# Patient Record
Sex: Male | Born: 1955 | Race: White | Hispanic: No | Marital: Married | Smoking: Never smoker
Health system: Southern US, Community
[De-identification: ages and names within clinical notes are randomized; demographics above are authoritative.]

## PROBLEM LIST (undated history)

## (undated) DIAGNOSIS — Z972 Presence of dental prosthetic device (complete) (partial): Secondary | ICD-10-CM

## (undated) DIAGNOSIS — K08109 Complete loss of teeth, unspecified cause, unspecified class: Secondary | ICD-10-CM

## (undated) DIAGNOSIS — K2270 Barrett's esophagus without dysplasia: Secondary | ICD-10-CM

## (undated) DIAGNOSIS — K219 Gastro-esophageal reflux disease without esophagitis: Secondary | ICD-10-CM

## (undated) DIAGNOSIS — R351 Nocturia: Secondary | ICD-10-CM

## (undated) DIAGNOSIS — R519 Headache, unspecified: Secondary | ICD-10-CM

## (undated) DIAGNOSIS — Z973 Presence of spectacles and contact lenses: Secondary | ICD-10-CM

## (undated) DIAGNOSIS — N201 Calculus of ureter: Secondary | ICD-10-CM

## (undated) DIAGNOSIS — Z8719 Personal history of other diseases of the digestive system: Secondary | ICD-10-CM

## (undated) DIAGNOSIS — H5089 Other specified strabismus: Secondary | ICD-10-CM

## (undated) DIAGNOSIS — E785 Hyperlipidemia, unspecified: Secondary | ICD-10-CM

## (undated) DIAGNOSIS — Z8711 Personal history of peptic ulcer disease: Secondary | ICD-10-CM

## (undated) DIAGNOSIS — Z860101 Personal history of adenomatous and serrated colon polyps: Secondary | ICD-10-CM

## (undated) DIAGNOSIS — N401 Enlarged prostate with lower urinary tract symptoms: Secondary | ICD-10-CM

## (undated) DIAGNOSIS — H509 Unspecified strabismus: Principal | ICD-10-CM

## (undated) DIAGNOSIS — M1990 Unspecified osteoarthritis, unspecified site: Secondary | ICD-10-CM

## (undated) DIAGNOSIS — I10 Essential (primary) hypertension: Secondary | ICD-10-CM

## (undated) DIAGNOSIS — Z87442 Personal history of urinary calculi: Secondary | ICD-10-CM

## (undated) DIAGNOSIS — Z86010 Personal history of colonic polyps: Secondary | ICD-10-CM

## (undated) HISTORY — DX: Other specified strabismus: H50.89

## (undated) HISTORY — DX: Unspecified strabismus: H50.9

## (undated) HISTORY — PX: COLONOSCOPY: SHX174

## (undated) HISTORY — PX: OTHER SURGICAL HISTORY: SHX169

---

## 2001-12-12 ENCOUNTER — Emergency Department (HOSPITAL_COMMUNITY): Admission: EM | Admit: 2001-12-12 | Discharge: 2001-12-12 | Payer: Self-pay | Admitting: Emergency Medicine

## 2001-12-12 ENCOUNTER — Encounter: Payer: Self-pay | Admitting: Emergency Medicine

## 2002-08-09 ENCOUNTER — Emergency Department (HOSPITAL_COMMUNITY): Admission: EM | Admit: 2002-08-09 | Discharge: 2002-08-09 | Payer: Self-pay

## 2005-06-22 ENCOUNTER — Encounter: Admission: RE | Admit: 2005-06-22 | Discharge: 2005-06-22 | Payer: Self-pay | Admitting: Internal Medicine

## 2005-11-21 ENCOUNTER — Emergency Department (HOSPITAL_COMMUNITY): Admission: EM | Admit: 2005-11-21 | Discharge: 2005-11-21 | Payer: Self-pay | Admitting: Emergency Medicine

## 2005-11-25 ENCOUNTER — Ambulatory Visit (HOSPITAL_COMMUNITY): Admission: RE | Admit: 2005-11-25 | Discharge: 2005-11-25 | Payer: Self-pay | Admitting: Sports Medicine

## 2005-11-26 ENCOUNTER — Ambulatory Visit (HOSPITAL_COMMUNITY): Admission: RE | Admit: 2005-11-26 | Discharge: 2005-11-27 | Payer: Self-pay | Admitting: Orthopedic Surgery

## 2005-12-08 ENCOUNTER — Ambulatory Visit (HOSPITAL_COMMUNITY): Admission: RE | Admit: 2005-12-08 | Discharge: 2005-12-09 | Payer: Self-pay | Admitting: Orthopedic Surgery

## 2006-01-06 ENCOUNTER — Encounter: Admission: RE | Admit: 2006-01-06 | Discharge: 2006-04-06 | Payer: Self-pay | Admitting: Orthopedic Surgery

## 2006-04-20 ENCOUNTER — Encounter: Admission: RE | Admit: 2006-04-20 | Discharge: 2006-07-19 | Payer: Self-pay | Admitting: Orthopedic Surgery

## 2009-05-15 ENCOUNTER — Ambulatory Visit: Payer: Self-pay | Admitting: Gastroenterology

## 2009-05-27 ENCOUNTER — Ambulatory Visit: Payer: Self-pay | Admitting: Gastroenterology

## 2009-05-27 ENCOUNTER — Encounter: Payer: Self-pay | Admitting: Gastroenterology

## 2009-06-05 ENCOUNTER — Encounter: Payer: Self-pay | Admitting: Gastroenterology

## 2011-04-17 NOTE — Op Note (Signed)
NAME:  Jason Guzman, Jason Guzman                  ACCOUNT NO.:  [id number]   MEDICAL RECORD NO.:  [id number]          PATIENT TYPE:  AMB   LOCATION:  SDS                          FACILITY:  [facility]   PHYSICIAN:  Deidre Ala, M.D.    DATE OF BIRTH:  1956-06-02   DATE OF PROCEDURE:  DATE OF DISCHARGE:                                 OPERATIVE REPORT   PREOPERATIVE DIAGNOSIS:  Complex recurrent posterior dislocation left elbow  with ligamentous injury radial head fracture.   POSTOPERATIVE DIAGNOSIS:  Complex recurrent posterior dislocation left elbow  with ligamentous injury radial head fracture.   PROCEDURES:  1.  Closed reduction of complex fracture dislocation ligamentous injury with      posterior elbow dislocation and placement of large size Synthes external      fixator.  2.  Use of intraoperative fluoroscopy.   SURGEON:  1.  Charlesetta Shanks, M.D.   ASSISTANT:  Clarene Reamer, P.A.-C.   ANESTHESIA:  General endotracheal anesthesia.   CULTURES:  None.   DRAINS:  None.   ESTIMATED BLOOD LOSS:  Minimal.   PATHOLOGIC FINDINGS AND HISTORY:  Jason Guzman fell from a scaffolding five days  ago, was seen at [facility] ER where posterior dislocation of the  elbow was noted.  He was reduced in the emergency room, splinted and came in  to see Dr. Farris Has two days ago.  Dr. Jerl Santos was the person on call.  In  any case, Dr. Farris Has had ordered a CT scan which showed the elbow to be  redislocated after post reduction films had shown the reduction in the  emergency room.  He was referred back to [place].  He had good pulses, so we  elected to proceed to take him to the operating room for closed reduction  and some sort of fixation, I was thinking a transfixing pin.  Dr. Carola Guzman was  consulted about the case and felt that an external fixator would be what he  would like to see put on for control of the lesion until decision could be  made about repairing collateral ligaments in the radial head.  It  became  more apparent that the radial head fracture had somewhat subluxed  posteriorly and had sheared off dorsally.  The cornoid process was basically  stable.  In any case we then preceded to reduce the dislocation with a  concentric reduction on the lateral view and we placed Synthes large  external fixator with two short Shantz pins dorsal to volar on the ulna and  basically anterior posterior on the humerus just at the shaft diaphyseal,  metaphyseal junction and further proximal.  We actually did it slightly  posterolateral, but more posterior.  We were able to get the reduction  obtained using traction on the crisscross rods and we did use some flexion.  The pulse was intact to Doppler evaluation at closure with all of the  hardware tightened up on the external fixator.   DESCRIPTION OF PROCEDURE:  With adequate anesthesia obtained using  endotracheal technique, the patient was placed in the supine position.  We  first reduced the elbow but felt it to be very unstable.  Consultation was  obtained from Dr. Carola Guzman who was in the operating room and we then prepped  and draped the arm, placed under C-arm fluoroscopy, the two short Shantz  pins in the ulna mid shaft and distal humerus large Shantz pins posteriorly  from front to back.  C-arm fluoroscopy confirmed positioning.  The external  fixator was put in place with crisscross of long carbon fiber rods that met  at the elbow area with the hinge. We then reduced the fracture, flexed it  slightly, pushed the ulna forward, got a concentric reduction, applied  traction and the flexion and set all the nuts and bolts of the fixator so it  was rigid.  The pin tracks were then dressed with a compressive Ace wrap.  Sling was placed.  The Doppler was used to check the pulse.  The patient  then, having tolerated the procedure well, was to be discharged per  outpatient routine to see Dr.  Carola Guzman next Wednesday for follow-up.  Dr.  Carola Guzman will see  him in the PACU.           ______________________________  V. Charlesetta Shanks, M.D.     VEP/MEDQ  D:  11/26/2005  T:  11/26/2005  Job:  [id number]   cc:   Jason Guzman, M.D.  Fax: [phone]   Jason Guzman, M.D.  Fax: [phone]   Jason Guzman, M.D.  Fax: [phone]

## 2011-04-17 NOTE — Op Note (Signed)
NAME:  Jason Guzman, Jason Guzman                  ACCOUNT NO.:  [id number]   MEDICAL RECORD NO.:  [id number]          PATIENT TYPE:  INP   LOCATION:  5729                         FACILITY:  [facility]   PHYSICIAN:  Doralee Albino. Carola Frost, M.D. DATE OF BIRTH:  06/03/1956   DATE OF PROCEDURE:  12/08/2005  DATE OF DISCHARGE:                                 OPERATIVE REPORT   PREOPERATIVE DIAGNOSIS:  Left elbow fracture dislocation, status post  external fixation.   POSTOPERATIVE DIAGNOSIS:  1.  Severely comminuted left radial head fracture.  2.  Complete disruption of Jason lateral collateral and medial collateral      ligaments.  3.  Multiple fracture fragments off Jason radius as well as Jason tip of Jason      coronoid in Jason elbow joint.  4.  Retained external fixator, left elbow.   PROCEDURES:  1.  Radial head arthroplasty using a [facility] long neck length with 22-mm      head.  2.  Open treatment of elbow dislocation.  3.  Lateral collateral ligament repair with local tissue.  4.  Removal of external fixator and under anesthesia.   SURGEON:  Myrene Galas.   ASSISTANT:  None.   ANESTHESIA:  General and regional.   SPECIMENS:  None.   TOURNIQUET:  None.   ESTIMATED BLOOD LOSS:  550 mL.   IV FLUIDS:  2350 mL crystalloid.   COMPLICATIONS:  None.   DISPOSITION:  PACU,  condition stable.   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 Jerl Santos with closed  reduction. In his absence, he followed up with Dr. Renae Fickle with a recurrent  dislocation. He was taken to Jason operating room where underwent spanning  external fixation with reduction of Jason joint.  He was subsequently referred  to me for definitive management. At Jason time I initially saw him, he was  markedly swollen and unable to undergo surgery at that time interval.  He  followed up a week  later where Jason soft tissues indicated they were ready to  proceed. Preoperatively we discussed risks and benefits of surgery including  Jason possibility of arthritis, instability, poor range of motion, malunion,  nonunion, thromboembolic complications. In addition, other perioperative  complications and, of course, also infection and neurovascular injury. Jason  patient and his Guzman wished to proceed.   DESCRIPTION OF PROCEDURE:  Jason Guzman was administered preoperative  antibiotics, taken to Jason operating room after administration of a regional  block in Jason preoperative area and general anesthesia was induced. He was  positioned supine. His left upper extremity was prepped and draped in Jason  usual sterile fashion.  After removing Jason external fixator, Jason pin sites  were cleaned with both Hibiclens scrub as well as a standard Betadine scrub  and paint.   A single posterior incision was made to facilitate any further surgery  required to Jason elbow and dissection carried at Jason fascial layer laterally  to Jason [facility]  interval. Jason muscle was split along Jason anconeus and Jason  radiocapitellar joint entered.  There was a shear injury of Jason radial head  as anticipated with severe comminution.  There were multiple bone fragments  that required removal.  It was clearly not reconstructable.  Jason lateral  collateral ligament had been completely avulsed from its insertion on Jason  lateral epicondyle as well.  There was a fleck of bone which had flipped  posteriorly associated with this.  There also could be felt some injury to  Jason coronoid process and multiple free fragments in Jason anterior aspect of  Jason joint. These would eventually be removed as well.  We were careful to  keep Jason posterior interosseous nerve protected throughout Jason procedure  using pronation of Jason forearm during dissection and doing all dissection  under direct visualization. Jason saw was used to make a transverse cut and  then  Jason gap was sized to Jason extended length radial head prosthesis. There  were no significant chondral injuries to gross inspection of Jason capitellum  or olecranon fossa where visualized. What remained of Jason radial head was  sized to a 24.  Both Jason 24 and 22 trials were inserted with Jason best fit  being with Jason 22. This dramatically improved Jason stability of Jason elbow and  enabled [place] to also perform a gentle manipulation to bring Jason patient's arm  back into more extension, given his prolonged flexion. Jason radial shaft was  repaired with broaching and a #3 stem and long neck and 22 mm prosthesis  were then implanted using standard technique.  Jason implant appeared to be  completely flush against Jason radial cut with excellent rotational control as  well. Once again, Jason elbow was taken through a range of motion and noted to  be grossly stable. There was rotatory instability, given Jason lack of repair  of Jason lateral collateral ligament at that point.   We then directed our attention toward Jason rest of Jason joint where we felt  that sufficient stability could be restored to Jason elbow without Jason need  for direct internal fixation of Jason multiple comminuted fractures off Jason  proximal ulna.  Consequently these were excised.  We then used Jason suture  anchor to place one in Jason central portion of Jason lateral collateral  ligament insertion on Jason lateral epicondyle. Jason area was abraded with a  rongeur and then a rugged rasp prior to placement of Jason anchor device.  Jason  initial anchor device was tested for stability and popped out. We then  placed a more stout 5 mm anchor with good pullout strength direct testing.  We then imbricated Jason local tissue in and around Jason area with some  considerable difficulty using a free needle.  We were able over time to  produce a very stout repair bringing elements of Jason capsule and lateral collateral ligament both anteriorly and posteriorly along with Jason  fleck of  bone that had been involved in Jason initial avulsion injury off Jason lateral  epicondyle. Jason base of Jason radiocapitellar joint was also imbricated. Jason  imbrication sutures were tied at multiple levels and extended all Jason way up  to Jason fascial repair of Jason Kocher interval using Jason FiberWire. Jason  radiocapitellar joint was closed with figure-of-eight #1 Vicryl as was Jason  fascia of Jason Kocher interval.  At that time we then examined Jason elbow for  stability and noted excellent stability with easy range of motion  from 30  degrees to 130 degrees. AP and lateral images showed concentric reduction  and excellent fit of Jason radial head prosthesis. Jason wound was then  copiously irrigated and Jason remainder closed in standard layered fashion  with 2-0 Vicryl and staples. A sterile gently compressive dressing and  posterior splint were then applied with Jason patient's elbow extended  slightly beyond 90 degrees. He is taken to PACU in stable condition.   PROGNOSIS:  Jason Guzman has sustained a severe injury to his left elbow and is  at significant risk for Jason development of complications related to this  including arthritis, persistent instability, decreased range of motion as  well as infection given Jason prior external fixator. We plan to see him back  in 8 days for removal of Jason splint and initiation of passive range of  motion in a supervised fashion with occupational therapy,  We will then  removed his staples at  two weeks.  I remain concerned about his ability to participate with  physical and occupational therapy, given his limited financial resources in  that he may be unable to pay for therapy which is absolutely necessary for a  good outcome from this injury.      Doralee Albino. Carola Frost, M.D.  Electronically Signed     MHH/MEDQ  D:  12/08/2005  T:  12/09/2005  Job:  [id number]

## 2011-08-24 ENCOUNTER — Other Ambulatory Visit: Payer: Self-pay | Admitting: Gastroenterology

## 2011-08-24 ENCOUNTER — Other Ambulatory Visit: Payer: Self-pay | Admitting: Internal Medicine

## 2011-08-31 ENCOUNTER — Ambulatory Visit
Admission: RE | Admit: 2011-08-31 | Discharge: 2011-08-31 | Disposition: A | Discharge status: Home / Self Care | Payer: 59 | Source: Ambulatory Visit | Attending: Gastroenterology | Admitting: Gastroenterology

## 2011-08-31 MED ORDER — IOHEXOL 300 MG/ML  SOLN
125.0000 mL | Freq: Once | INTRAMUSCULAR | Status: AC | PRN
  Administered 2011-08-31: 125 mL via INTRAVENOUS

## 2011-09-07 ENCOUNTER — Other Ambulatory Visit: Payer: Self-pay | Admitting: Gastroenterology

## 2011-09-07 HISTORY — PX: ESOPHAGOGASTRODUODENOSCOPY: SHX1529

## 2013-02-06 ENCOUNTER — Other Ambulatory Visit: Payer: Self-pay | Admitting: Gastroenterology

## 2013-08-04 ENCOUNTER — Encounter: Payer: Self-pay | Admitting: Neurology

## 2013-08-04 DIAGNOSIS — H02409 Unspecified ptosis of unspecified eyelid: Secondary | ICD-10-CM | POA: Insufficient documentation

## 2013-08-04 DIAGNOSIS — H02401 Unspecified ptosis of right eyelid: Secondary | ICD-10-CM

## 2013-08-07 ENCOUNTER — Encounter: Payer: Self-pay | Admitting: Neurology

## 2013-08-07 ENCOUNTER — Ambulatory Visit (INDEPENDENT_AMBULATORY_CARE_PROVIDER_SITE_OTHER): Payer: 59 | Admitting: Neurology

## 2013-08-07 VITALS — BP 179/93 | HR 67 | Ht 67.0 in | Wt 236.0 lb

## 2013-08-07 DIAGNOSIS — H519 Unspecified disorder of binocular movement: Secondary | ICD-10-CM

## 2013-08-07 DIAGNOSIS — H509 Unspecified strabismus: Secondary | ICD-10-CM

## 2013-08-07 HISTORY — DX: Unspecified strabismus: H50.9

## 2013-08-07 NOTE — Progress Notes (Signed)
Reason for visit: Double vision  Jason Guzman is a 57 y.o. 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 The patient indicates that he does not have double vision when looking straight ahead, looking up, looking down, or looking to the right. When he looks to the left, the double vision ensues. The patient has some narrowing of the interpalpebral fissure. The patient denies any other associated symptoms of headache, speech problems, swallowing problems, numbness or weakness of the face, arms, or legs. This double vision issue has been present throughout his entire life, and he was told as a child that he would "grow out of it", but this never happened. The patient was seen by his optometrist, and he was referred to this office for an evaluation. The visual acuity from one eye to next is normal and symmetric.  Past Medical History  Diagnosis Date  . Strabismus 08/07/2013  . Duane retraction syndrome     left    Past Surgical History  Procedure Laterality Date  . Orif elbow fracture Left     History reviewed. No pertinent family history.  Social history:  reports that he has never smoked. He has never used smokeless tobacco. He reports that he does not drink alcohol or use illicit drugs.  Medications:  No current outpatient prescriptions on file prior to visit.   No current facility-administered medications on file prior to visit.     Allergies no known allergies  ROS:  Out of a complete 14 system review of symptoms, the patient complains only of the following symptoms, and all other reviewed systems are negative.  Double vision   Blood pressure 179/93, pulse 67, height 5\' 7"  (1.702 m), weight 236 lb (107.049 kg).  Physical Exam  General: The patient is alert and cooperative at the time of the examination. The patient is minimally obese.  Head: Pupils  are equal, round, and reactive to light. Discs are flat bilaterally.  Neck: The neck is supple, no carotid bruits are noted.  Respiratory: The respiratory examination is clear.  Cardiovascular: The cardiovascular examination reveals a regular rate and rhythm, no obvious murmurs or rubs are noted.  Skin: Extremities are without significant edema.  Neurologic Exam  Mental status:  Cranial nerves: Facial symmetry is present. There is good sensation of the face to pinprick and soft touch bilaterally. The strength of the facial muscles and the muscles to head turning and shoulder shrug are normal bilaterally. Speech is well enunciated, no aphasia or dysarthria is noted. Extraocular movements are full. Visual fields are full, with exception of incomplete abduction of the left eye with left lateral gaze.  Motor: The motor testing reveals 5 over 5 strength of all 4 extremities. Good symmetric motor tone is noted throughout.  Sensory: Sensory testing is intact to pinprick, soft touch, vibration sensation, and position sense on all 4 extremities. No evidence of extinction is noted.  Coordination: Cerebellar testing reveals good finger-nose-finger and heel-to-shin bilaterally.  Gait and station: Gait is normal. Tandem gait is normal. Romberg is negative. No drift is seen.  Reflexes: Deep tendon reflexes are symmetric and normal bilaterally. Toes are downgoing bilaterally.   Assessment/Plan:  1. 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 The patient may have some retraction of the eye with right conjugate gaze involving the  left eye. The patient fortunately does not report any double vision with looking straight ahead. I am not clear that this issue can be surgically corrected, but if the patient wishes to have an opinion from an ophthalmologist, I will get this set up. The patient otherwise will  followup through this office if needed.  Marlan Palau MD 08/07/2013 8:58 AM  [facility] [address] [place], [place] [address]  Phone [phone] Fax [phone]

## 2014-03-21 ENCOUNTER — Encounter: Payer: Self-pay | Admitting: *Deleted

## 2014-03-23 ENCOUNTER — Encounter: Payer: Self-pay | Admitting: Gastroenterology

## 2014-08-01 ENCOUNTER — Encounter: Payer: Self-pay | Admitting: Gastroenterology

## 2014-08-20 ENCOUNTER — Other Ambulatory Visit: Payer: Self-pay | Admitting: Gastroenterology

## 2017-02-15 DIAGNOSIS — I10 Essential (primary) hypertension: Secondary | ICD-10-CM | POA: Diagnosis not present

## 2017-02-15 DIAGNOSIS — E7800 Pure hypercholesterolemia, unspecified: Secondary | ICD-10-CM | POA: Diagnosis not present

## 2017-02-15 DIAGNOSIS — Z0000 Encounter for general adult medical examination without abnormal findings: Secondary | ICD-10-CM | POA: Diagnosis not present

## 2017-03-02 DIAGNOSIS — H903 Sensorineural hearing loss, bilateral: Secondary | ICD-10-CM | POA: Diagnosis not present

## 2017-03-02 DIAGNOSIS — H9311 Tinnitus, right ear: Secondary | ICD-10-CM | POA: Diagnosis not present

## 2017-03-02 DIAGNOSIS — H9313 Tinnitus, bilateral: Secondary | ICD-10-CM | POA: Diagnosis not present

## 2017-03-02 DIAGNOSIS — H6122 Impacted cerumen, left ear: Secondary | ICD-10-CM | POA: Diagnosis not present

## 2017-03-02 DIAGNOSIS — H8110 Benign paroxysmal vertigo, unspecified ear: Secondary | ICD-10-CM | POA: Diagnosis not present

## 2017-03-27 DIAGNOSIS — R350 Frequency of micturition: Secondary | ICD-10-CM | POA: Diagnosis not present

## 2017-04-14 DIAGNOSIS — R300 Dysuria: Secondary | ICD-10-CM | POA: Diagnosis not present

## 2017-07-10 ENCOUNTER — Encounter (HOSPITAL_COMMUNITY): Payer: Self-pay

## 2017-07-10 ENCOUNTER — Emergency Department (HOSPITAL_COMMUNITY)
Admission: EM | Admit: 2017-07-10 | Discharge: 2017-07-11 | Disposition: A | Discharge status: Home / Self Care | Payer: 59 | Attending: Emergency Medicine | Admitting: Emergency Medicine

## 2017-07-10 DIAGNOSIS — N200 Calculus of kidney: Secondary | ICD-10-CM | POA: Diagnosis not present

## 2017-07-10 DIAGNOSIS — R109 Unspecified abdominal pain: Secondary | ICD-10-CM | POA: Diagnosis present

## 2017-07-10 DIAGNOSIS — I10 Essential (primary) hypertension: Secondary | ICD-10-CM | POA: Insufficient documentation

## 2017-07-10 HISTORY — DX: Essential (primary) hypertension: I10

## 2017-07-10 LAB — URINALYSIS, ROUTINE W REFLEX MICROSCOPIC
BACTERIA UA: NONE SEEN
Bilirubin Urine: NEGATIVE
Glucose, UA: NEGATIVE mg/dL
KETONES UR: NEGATIVE mg/dL
LEUKOCYTES UA: NEGATIVE
Nitrite: NEGATIVE
PROTEIN: NEGATIVE mg/dL
Specific Gravity, Urine: 1.016 (ref 1.005–1.030)
pH: 6 (ref 5.0–8.0)

## 2017-07-10 LAB — CBC
HEMATOCRIT: 43.3 % (ref 39.0–52.0)
Hemoglobin: 15 g/dL (ref 13.0–17.0)
MCH: 29.5 pg (ref 26.0–34.0)
MCHC: 34.6 g/dL (ref 30.0–36.0)
MCV: 85.2 fL (ref 78.0–100.0)
Platelets: 134 10*3/uL — ABNORMAL LOW (ref 150–400)
RBC: 5.08 MIL/uL (ref 4.22–5.81)
RDW: 12.5 % (ref 11.5–15.5)
WBC: 6.1 10*3/uL (ref 4.0–10.5)

## 2017-07-10 LAB — COMPREHENSIVE METABOLIC PANEL
ALBUMIN: 4.3 g/dL (ref 3.5–5.0)
ALT: 31 U/L (ref 17–63)
AST: 25 U/L (ref 15–41)
Alkaline Phosphatase: 69 U/L (ref 38–126)
Anion gap: 11 (ref 5–15)
BUN: 12 mg/dL (ref 6–20)
CHLORIDE: 103 mmol/L (ref 101–111)
CO2: 24 mmol/L (ref 22–32)
Calcium: 9.4 mg/dL (ref 8.9–10.3)
Creatinine, Ser: 1.14 mg/dL (ref 0.61–1.24)
GFR calc Af Amer: >60 mL/min (ref >60)
GFR calc non Af Amer: >60 mL/min (ref >60)
GLUCOSE: 121 mg/dL — AB (ref 65–99)
POTASSIUM: 3.8 mmol/L (ref 3.5–5.1)
Sodium: 138 mmol/L (ref 135–145)
Total Bilirubin: 0.9 mg/dL (ref 0.3–1.2)
Total Protein: 6.9 g/dL (ref 6.5–8.1)

## 2017-07-10 LAB — LIPASE, BLOOD: LIPASE: 31 U/L (ref 11–51)

## 2017-07-10 MED ORDER — ONDANSETRON HCL 4 MG/2ML IJ SOLN
4.0000 mg | Freq: Once | INTRAMUSCULAR | Status: AC
  Administered 2017-07-10: 4 mg via INTRAVENOUS
  Filled 2017-07-10: qty 2

## 2017-07-10 MED ORDER — SODIUM CHLORIDE 0.9 % IV BOLUS (SEPSIS)
1000.0000 mL | Freq: Once | INTRAVENOUS | Status: AC
  Administered 2017-07-10: 1000 mL via INTRAVENOUS

## 2017-07-10 MED ORDER — KETOROLAC TROMETHAMINE 30 MG/ML IJ SOLN
30.0000 mg | Freq: Once | INTRAMUSCULAR | Status: AC
  Administered 2017-07-10: 30 mg via INTRAVENOUS
  Filled 2017-07-10: qty 1

## 2017-07-10 NOTE — ED Notes (Signed)
MD at bedside. 

## 2017-07-10 NOTE — ED Notes (Signed)
Patient transported to CT 

## 2017-07-10 NOTE — ED Notes (Signed)
Patient ambulatory to the room, no signs of distress noted at this time

## 2017-07-10 NOTE — ED Triage Notes (Signed)
Onset today left lateral abd and left flank pain, dysuria.  No blood noted in urine or fever.

## 2017-07-10 NOTE — ED Provider Notes (Signed)
[facility] DEPT Provider Note   CSN: [id number] Arrival date & time: 07/10/17  2201  By signing my name below, I, Dora Sims, attest that this documentation has been prepared under the direction and in the presence of physician practitioner, Danicka Hourihan, Annie Main, MD. Electronically Signed: Dora Sims, Scribe. 07/10/2017. 11:24 PM.  History   Chief Complaint Chief Complaint  Patient presents with  . Dysuria  . Flank Pain   The history is provided by the patient. No language interpreter was used.    HPI Comments: Jason Guzman is a 61 y.o. male who presents to the Emergency Department for evaluation of persistent dysuria and left flank pain beginning earlier today. He states the dysuria began shortly before onset of his flank pain. The pain has been waxing and waning and is non-radiating. He notes associated urinary frequency/urgency and some urinary retention. There are no alleviating factors noted; he has tried Tylenol without relief. Patient has experienced dysuria in the past but denies any h/o UTI or kidney stones. No h/o abdominal surgeries or prostate problems. He denies testicular pain, abdominal pain, constipation, diarrhea, hematuria, fevers, chills, nausea, vomiting, or any other associated symptoms.  Past Medical History:  Diagnosis Date  . Duane retraction syndrome    left  . Hypercholesteremia   . Hyperplastic colon polyp    2010  . Hypertension   . Strabismus 08/07/2013  . Tubular adenoma of colon    2010    Patient Active Problem List   Diagnosis Date Noted  . Strabismus 08/07/2013  . Ptosis 08/04/2013    Past Surgical History:  Procedure Laterality Date  . ORIF ELBOW FRACTURE Left        Home Medications    Prior to Admission medications   Not on File    Family History History reviewed. No pertinent family history.  Social History Social History  Substance Use Topics  . Smoking status: Never Smoker  . Smokeless tobacco: Never Used  .  Alcohol use No     Allergies   Patient has no known allergies.   Review of Systems Review of Systems All other systems reviewed and are negative for acute change except as noted in the HPI. Physical Exam Updated Vital Signs BP 138/70   Pulse 71   Temp (!) 97.5 F (36.4 C) (Oral)   Resp 18   SpO2 96%   Physical Exam  Constitutional: He is oriented to person, place, and time. He appears well-developed and well-nourished. No distress.  Appears well.  HENT:  Head: Normocephalic and atraumatic.  Mouth/Throat: Oropharynx is clear and moist. No oropharyngeal exudate.  Eyes: Pupils are equal, round, and reactive to light. Conjunctivae and EOM are normal.  Neck: Normal range of motion. Neck supple.  No meningismus.  Cardiovascular: Normal rate, regular rhythm, normal heart sounds and intact distal pulses.   No murmur heard. Pulmonary/Chest: Effort normal and breath sounds normal. No respiratory distress.  Abdominal: Soft. There is no tenderness. There is CVA tenderness (left). There is no rebound and no guarding.  No abdominal tenderness.  Genitourinary:  Genitourinary Comments: No testicular pain.  Musculoskeletal: Normal range of motion. He exhibits no edema or tenderness.  Neurological: He is alert and oriented to person, place, and time. No cranial nerve deficit. He exhibits normal muscle tone. Coordination normal.   5/5 strength throughout. CN 2-12 intact.Equal grip strength.   Skin: Skin is warm.  Psychiatric: He has a normal mood and affect. His behavior is normal.  Nursing note and vitals  reviewed.  ED Treatments / Results  Labs (all labs ordered are listed, but only abnormal results are displayed) Labs Reviewed  COMPREHENSIVE METABOLIC PANEL - Abnormal; Notable for the following:       Result Value   Glucose, Bld 121 (*)    All other components within normal limits  CBC - Abnormal; Notable for the following:    Platelets 134 (*)    All other components within  normal limits  URINALYSIS, ROUTINE W REFLEX MICROSCOPIC - Abnormal; Notable for the following:    Hgb urine dipstick MODERATE (*)    Squamous Epithelial / LPF 0-5 (*)    All other components within normal limits  LIPASE, BLOOD    EKG  EKG Interpretation None       Radiology Ct Renal Stone Study  Result Date: 07/11/2017 CLINICAL DATA:  Acute onset of left flank pain and dysuria. Initial encounter. EXAM: CT ABDOMEN AND PELVIS WITHOUT CONTRAST TECHNIQUE: Multidetector CT imaging of the abdomen and pelvis was performed following the standard protocol without IV contrast. COMPARISON:  CT of the abdomen and pelvis performed 08/31/2011 FINDINGS: Lower chest: The visualized lung bases are grossly clear. The visualized portions of the mediastinum are unremarkable. Hepatobiliary: The liver is unremarkable in appearance. A stone is noted within the gallbladder. The gallbladder is otherwise unremarkable in appearance. The common bile duct remains normal in caliber. Pancreas: The pancreas is within normal limits. Spleen: The spleen is unremarkable in appearance. Adrenals/Urinary Tract: The adrenal glands are unremarkable in appearance. There is minimal left-sided hydronephrosis, with left-sided perinephric stranding. An obstructing 6 x 6 mm stone is noted at the left side of the base of the bladder, at the left vesicoureteral junction. No nonobstructing renal stones are identified. Stomach/Bowel: The stomach is unremarkable in appearance. The small bowel is within normal limits. The appendix is normal in caliber, without evidence of appendicitis. The colon is unremarkable in appearance. Vascular/Lymphatic: Scattered calcification is seen along the abdominal aorta and its branches. The abdominal aorta is otherwise grossly unremarkable. The inferior vena cava is grossly unremarkable. No retroperitoneal lymphadenopathy is seen. No pelvic sidewall lymphadenopathy is identified. Reproductive: The bladder is mildly  distended and otherwise unremarkable. The prostate is normal in size. Other: No additional soft tissue abnormalities are seen. Musculoskeletal: No acute osseous abnormalities are identified. The visualized musculature is unremarkable in appearance. IMPRESSION: 1. Minimal left-sided hydronephrosis, with an obstructing 6 x 6 mm stone at the left side of the base of the bladder, at the left vesicoureteral junction. 2. Scattered aortic atherosclerosis. 3. Cholelithiasis.  Gallbladder otherwise unremarkable. Electronically Signed   By: Garald Balding M.D.   On: 07/11/2017 00:14    Procedures Procedures (including critical care time)  DIAGNOSTIC STUDIES: Oxygen Saturation is 96% on RA, adequate by my interpretation.    COORDINATION OF CARE: 11:23 PM Discussed treatment plan with pt at bedside and pt agreed to plan.  Medications Ordered in ED Medications  sodium chloride 0.9 % bolus 1,000 mL (not administered)  ondansetron (ZOFRAN) injection 4 mg (not administered)  ketorolac (TORADOL) 30 MG/ML injection 30 mg (not administered)     Initial Impression / Assessment and Plan / ED Course  I have reviewed the triage vital signs and the nursing notes.  Pertinent labs & imaging results that were available during my care of the patient were reviewed by me and considered in my medical decision making (see chart for details).     L flank pain and dysuria since this morning. No  fever or vomiting. No testicular pain. No abdominal pain.    UA with blood, no infection CT obtained to evaluate for kidney stone.  CT confirms left sided 6 mm UVJ stone Creatinine is stable. Urinalysis shows no signs of infection.  Patient's pain is controlled. We will treat supportively and follow-up urology next week. Return precautions discussed including worsening pain, vomiting, fever or any other concerns.   Final Clinical Impressions(s) / ED Diagnoses   Final diagnoses:  Kidney stone    New  Prescriptions New Prescriptions   No medications on file   I personally performed the services described in this documentation, which was scribed in my presence. The recorded information has been reviewed and is accurate.    Ezequiel Essex, MD 07/11/17 [phone]

## 2017-07-11 ENCOUNTER — Other Ambulatory Visit (HOSPITAL_COMMUNITY): Payer: Self-pay

## 2017-07-11 ENCOUNTER — Emergency Department (HOSPITAL_COMMUNITY): Payer: 59

## 2017-07-11 ENCOUNTER — Other Ambulatory Visit (HOSPITAL_COMMUNITY): Payer: 59

## 2017-07-11 DIAGNOSIS — R109 Unspecified abdominal pain: Secondary | ICD-10-CM | POA: Diagnosis not present

## 2017-07-11 MED ORDER — ONDANSETRON 4 MG PO TBDP: 4.0000 mg | ORAL_TABLET | Freq: Three times a day (TID) | ORAL | 0 refills | Status: DC | PRN

## 2017-07-11 MED ORDER — OXYCODONE-ACETAMINOPHEN 5-325 MG PO TABS: 1.0000 | ORAL_TABLET | ORAL | 0 refills | Status: DC | PRN

## 2017-07-11 MED ORDER — TAMSULOSIN HCL 0.4 MG PO CAPS: 0.4000 mg | ORAL_CAPSULE | Freq: Every day | ORAL | 0 refills | Status: DC

## 2017-07-11 MED ORDER — IBUPROFEN 800 MG PO TABS: 800.0000 mg | ORAL_TABLET | Freq: Three times a day (TID) | ORAL | 0 refills | Status: DC

## 2017-07-11 NOTE — Discharge Instructions (Signed)
Take the pain and nausea medications as prescribed. Follow up with the urologist. Return to the ED if you develop worsening pain, vomiting, fever, or any other concerns.

## 2017-07-11 NOTE — ED Notes (Signed)
Patient Alert and oriented X4. Stable and ambulatory. Patient verbalized understanding of the discharge instructions.  Patient belongings were taken by the patient.  

## 2017-07-13 ENCOUNTER — Other Ambulatory Visit: Payer: Self-pay | Admitting: Urology

## 2017-07-13 DIAGNOSIS — R3915 Urgency of urination: Secondary | ICD-10-CM | POA: Diagnosis not present

## 2017-07-13 DIAGNOSIS — N201 Calculus of ureter: Secondary | ICD-10-CM | POA: Diagnosis not present

## 2017-07-20 ENCOUNTER — Encounter (HOSPITAL_BASED_OUTPATIENT_CLINIC_OR_DEPARTMENT_OTHER): Payer: Self-pay | Admitting: *Deleted

## 2017-07-20 NOTE — Progress Notes (Signed)
NPO AFTER MN W/ EXCEPTION CLEAR LIQUIDS UNTIL 0800 (NO CREAM/MILK PRODUCTS).  ARRIVE AT 1230.  NEEDS ISTAT 8 AND EKG.  WILL TAKE PRILOSEC AM DOS W/ SIPS OF WATER.

## 2017-07-27 NOTE — H&P (Signed)
Jason Guzman is an 61 y.o. male.    Chief Complaint: Pre-op LEFT Ureteroscopic stone manipulation  HPI:   1 - Left Ureteral Stone - 50mm left UVJ stone with mild hydro by ER CT 06/2017. Stone is 46mm, solitary, 10cm SSD, 1100HU. UA without infectious parameters. Cr 1.14. No additional or prior stones. Pain presently managed on tylenol with oxycodone for breakthrough.   PMH sig for strabismus, HLD, HTN. 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 His PCP is Marda Stalker with [place].   Today "Jason Guzman" is seen to proceed with LEFT ureteroscopic stone manipulation. No interval fevers. Most recent UA without infectious parameters.    Past Medical History:  Diagnosis Date  . 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 BPH associated with nocturia   . Duane retraction syndrome    left eye  . Full dentures   . GERD (gastroesophageal reflux disease)   . History of adenomatous polyp of colon    2010 tubular adenoma and hyperplastic polyps/  08-20-2014  tubular adenoma  . History of gastric ulcer    remote hx  . Hyperlipidemia   . Hypertension   . Left ureteral stone   . Strabismus    left eye  . Wears glasses     Past Surgical History:  Procedure Laterality Date  . CLOSED REDUCTION AND EXTERNAL FIXATOR FOR COMPLEX FRACTURE  11/26/2005  dr Eddie Dibbles at [facility]   complex radial head fx dislocation ligamentous injury (elbow)  . COLONOSCOPY  last one 08-20-2014  . ESOPHAGOGASTRODUODENOSCOPY  09/07/2011  . REMOVAL EXTERNAL FIXATOR/ RADICAL HEAD ARTHROPLASTY/ LIGAMENT REPAIR/ TREATMENT DISLOCATON Left 12/08/2005   dr handy at [facility]    History reviewed. No pertinent family history. Social History:  reports that he has never smoked. He has never used smokeless tobacco. He reports that he does not drink alcohol or use drugs.  Allergies: No Known Allergies  No prescriptions prior to admission.    No results found for this or any previous visit (from the past 48 hour(s)). No results found.  Review  of Systems  Constitutional: Negative.  Negative for chills and fever.  HENT: Negative.   Eyes: Negative.   Respiratory: Negative.   Cardiovascular: Negative.   Gastrointestinal: Negative.   Genitourinary: Positive for flank pain, frequency and urgency.  Musculoskeletal: Negative for myalgias.  Skin: Negative.   Neurological: Negative.   Endo/Heme/Allergies: Negative.   Psychiatric/Behavioral: Negative.     Height 5\' 10"  (1.778 m), weight 104.3 kg (230 lb). Physical Exam  Constitutional: He appears well-developed.  HENT:  Head: Normocephalic.  Eyes: Pupils are equal, round, and reactive to light.  Neck: Normal range of motion.  Cardiovascular: Normal rate.   Respiratory: Effort normal.  GI: Soft.  Genitourinary:  Genitourinary Comments: Mild Left CVAT at present.   Musculoskeletal: Normal range of motion.  Neurological: He is alert.  Skin: Skin is warm.  Psychiatric: He has a normal mood and affect.     Assessment/Plan  Proceed as planned with LEFT ureteroscopic stone manipulation. Risks, benefits, alternatives, expected peri-op course discussed previously and reiterated today.   Alexis Frock, MD 07/27/2017, 10:51 PM

## 2017-07-28 ENCOUNTER — Encounter (HOSPITAL_BASED_OUTPATIENT_CLINIC_OR_DEPARTMENT_OTHER)
Admission: RE | Disposition: A | Discharge status: Home / Self Care | Payer: Self-pay | Source: Ambulatory Visit | Attending: Urology

## 2017-07-28 ENCOUNTER — Other Ambulatory Visit: Payer: Self-pay

## 2017-07-28 ENCOUNTER — Encounter (HOSPITAL_BASED_OUTPATIENT_CLINIC_OR_DEPARTMENT_OTHER): Payer: Self-pay | Admitting: *Deleted

## 2017-07-28 ENCOUNTER — Ambulatory Visit (HOSPITAL_BASED_OUTPATIENT_CLINIC_OR_DEPARTMENT_OTHER): Payer: 59 | Admitting: Anesthesiology

## 2017-07-28 ENCOUNTER — Ambulatory Visit (HOSPITAL_BASED_OUTPATIENT_CLINIC_OR_DEPARTMENT_OTHER)
Admission: RE | Admit: 2017-07-28 | Discharge: 2017-07-28 | Disposition: A | Discharge status: Home / Self Care | Payer: 59 | Source: Ambulatory Visit | Attending: Urology | Admitting: Urology

## 2017-07-28 DIAGNOSIS — N201 Calculus of ureter: Secondary | ICD-10-CM | POA: Diagnosis not present

## 2017-07-28 DIAGNOSIS — Z79899 Other long term (current) drug therapy: Secondary | ICD-10-CM | POA: Diagnosis not present

## 2017-07-28 DIAGNOSIS — Z01810 Encounter for preprocedural cardiovascular examination: Secondary | ICD-10-CM | POA: Diagnosis not present

## 2017-07-28 DIAGNOSIS — K219 Gastro-esophageal reflux disease without esophagitis: Secondary | ICD-10-CM | POA: Diagnosis not present

## 2017-07-28 DIAGNOSIS — I10 Essential (primary) hypertension: Secondary | ICD-10-CM | POA: Diagnosis not present

## 2017-07-28 DIAGNOSIS — E785 Hyperlipidemia, unspecified: Secondary | ICD-10-CM | POA: Insufficient documentation

## 2017-07-28 HISTORY — DX: Personal history of other diseases of the digestive system: Z87.19

## 2017-07-28 HISTORY — DX: Barrett's esophagus without dysplasia: K22.70

## 2017-07-28 HISTORY — DX: Presence of dental prosthetic device (complete) (partial): Z97.2

## 2017-07-28 HISTORY — DX: Personal history of colonic polyps: Z86.010

## 2017-07-28 HISTORY — DX: Nocturia: R35.1

## 2017-07-28 HISTORY — DX: Benign prostatic hyperplasia with lower urinary tract symptoms: N40.1

## 2017-07-28 HISTORY — DX: Calculus of ureter: N20.1

## 2017-07-28 HISTORY — DX: Presence of spectacles and contact lenses: Z97.3

## 2017-07-28 HISTORY — DX: Complete loss of teeth, unspecified cause, unspecified class: K08.109

## 2017-07-28 HISTORY — DX: Gastro-esophageal reflux disease without esophagitis: K21.9

## 2017-07-28 HISTORY — DX: Hyperlipidemia, unspecified: E78.5

## 2017-07-28 HISTORY — DX: Personal history of peptic ulcer disease: Z87.11

## 2017-07-28 HISTORY — PX: CYSTOSCOPY/URETEROSCOPY/HOLMIUM LASER/STENT PLACEMENT: SHX6546

## 2017-07-28 HISTORY — DX: Personal history of adenomatous and serrated colon polyps: Z86.0101

## 2017-07-28 LAB — POCT I-STAT, CHEM 8
BUN: 12 mg/dL (ref 6–20)
CALCIUM ION: 1.2 mmol/L (ref 1.15–1.40)
CHLORIDE: 103 mmol/L (ref 101–111)
Creatinine, Ser: 0.8 mg/dL (ref 0.61–1.24)
Glucose, Bld: 96 mg/dL (ref 65–99)
HEMATOCRIT: 44 % (ref 39.0–52.0)
Hemoglobin: 15 g/dL (ref 13.0–17.0)
Potassium: 4.3 mmol/L (ref 3.5–5.1)
SODIUM: 140 mmol/L (ref 135–145)
TCO2: 27 mmol/L (ref 22–32)

## 2017-07-28 SURGERY — CYSTOSCOPY/URETEROSCOPY/HOLMIUM LASER/STENT PLACEMENT
Anesthesia: General | Site: Ureter | Laterality: Left

## 2017-07-28 MED ORDER — KETOROLAC TROMETHAMINE 30 MG/ML IJ SOLN
INTRAMUSCULAR | Status: AC
  Filled 2017-07-28: qty 1

## 2017-07-28 MED ORDER — PROPOFOL 10 MG/ML IV BOLUS
INTRAVENOUS | Status: AC
  Filled 2017-07-28: qty 20

## 2017-07-28 MED ORDER — LACTATED RINGERS IV SOLN
INTRAVENOUS | Status: DC
  Administered 2017-07-28 (×2): via INTRAVENOUS
  Filled 2017-07-28: qty 1000

## 2017-07-28 MED ORDER — CEPHALEXIN 500 MG PO CAPS: 500.0000 mg | ORAL_CAPSULE | Freq: Two times a day (BID) | ORAL | 0 refills | Status: DC

## 2017-07-28 MED ORDER — MIDAZOLAM HCL 2 MG/2ML IJ SOLN
INTRAMUSCULAR | Status: AC
  Filled 2017-07-28: qty 2

## 2017-07-28 MED ORDER — FENTANYL CITRATE (PF) 100 MCG/2ML IJ SOLN
INTRAMUSCULAR | Status: AC
  Filled 2017-07-28: qty 2

## 2017-07-28 MED ORDER — LIDOCAINE 2% (20 MG/ML) 5 ML SYRINGE
INTRAMUSCULAR | Status: DC | PRN
  Administered 2017-07-28: 100 mg via INTRAVENOUS

## 2017-07-28 MED ORDER — MEPERIDINE HCL 25 MG/ML IJ SOLN
6.2500 mg | INTRAMUSCULAR | Status: DC | PRN
  Filled 2017-07-28: qty 1

## 2017-07-28 MED ORDER — PROPOFOL 10 MG/ML IV BOLUS
INTRAVENOUS | Status: DC | PRN
  Administered 2017-07-28: 200 mg via INTRAVENOUS
  Administered 2017-07-28: 20 mg via INTRAVENOUS

## 2017-07-28 MED ORDER — DEXAMETHASONE SODIUM PHOSPHATE 10 MG/ML IJ SOLN
INTRAMUSCULAR | Status: AC
Start: 2017-07-28 — End: 2017-07-28
  Filled 2017-07-28: qty 1

## 2017-07-28 MED ORDER — ONDANSETRON HCL 4 MG/2ML IJ SOLN
INTRAMUSCULAR | Status: DC | PRN
  Administered 2017-07-28: 4 mg via INTRAVENOUS

## 2017-07-28 MED ORDER — EPHEDRINE 5 MG/ML INJ
INTRAVENOUS | Status: AC
  Filled 2017-07-28: qty 10

## 2017-07-28 MED ORDER — DEXAMETHASONE SODIUM PHOSPHATE 4 MG/ML IJ SOLN
INTRAMUSCULAR | Status: DC | PRN
  Administered 2017-07-28: 10 mg via INTRAVENOUS

## 2017-07-28 MED ORDER — INDIGOTINDISULFONATE SODIUM 8 MG/ML IJ SOLN
INTRAMUSCULAR | Status: DC | PRN
  Administered 2017-07-28: 5 mL via INTRAVENOUS

## 2017-07-28 MED ORDER — LIDOCAINE 2% (20 MG/ML) 5 ML SYRINGE
INTRAMUSCULAR | Status: AC
  Filled 2017-07-28: qty 5

## 2017-07-28 MED ORDER — SODIUM CHLORIDE 0.9 % IR SOLN
Status: DC | PRN
  Administered 2017-07-28: 3000 mL via INTRAVESICAL

## 2017-07-28 MED ORDER — MIDAZOLAM HCL 5 MG/5ML IJ SOLN
INTRAMUSCULAR | Status: DC | PRN
  Administered 2017-07-28: 2 mg via INTRAVENOUS

## 2017-07-28 MED ORDER — KETOROLAC TROMETHAMINE 10 MG PO TABS: 10.0000 mg | ORAL_TABLET | Freq: Four times a day (QID) | ORAL | 1 refills | Status: DC | PRN

## 2017-07-28 MED ORDER — FENTANYL CITRATE (PF) 100 MCG/2ML IJ SOLN
INTRAMUSCULAR | Status: DC | PRN
  Administered 2017-07-28 (×2): 50 ug via INTRAVENOUS
  Administered 2017-07-28 (×2): 25 ug via INTRAVENOUS

## 2017-07-28 MED ORDER — PROMETHAZINE HCL 25 MG/ML IJ SOLN
6.2500 mg | INTRAMUSCULAR | Status: DC | PRN
  Filled 2017-07-28: qty 1

## 2017-07-28 MED ORDER — OXYCODONE HCL 5 MG/5ML PO SOLN
5.0000 mg | Freq: Once | ORAL | Status: DC | PRN
  Filled 2017-07-28: qty 5

## 2017-07-28 MED ORDER — IOHEXOL 300 MG/ML  SOLN
INTRAMUSCULAR | Status: DC | PRN
  Administered 2017-07-28: 10 mL

## 2017-07-28 MED ORDER — EPHEDRINE SULFATE-NACL 50-0.9 MG/10ML-% IV SOSY
PREFILLED_SYRINGE | INTRAVENOUS | Status: DC | PRN
  Administered 2017-07-28: 15 mg via INTRAVENOUS

## 2017-07-28 MED ORDER — ONDANSETRON HCL 4 MG/2ML IJ SOLN
INTRAMUSCULAR | Status: AC
  Filled 2017-07-28: qty 2

## 2017-07-28 MED ORDER — GENTAMICIN SULFATE 40 MG/ML IJ SOLN
5.0000 mg/kg | INTRAVENOUS | Status: AC
  Administered 2017-07-28: 420 mg via INTRAVENOUS
  Filled 2017-07-28: qty 10.5

## 2017-07-28 MED ORDER — KETOROLAC TROMETHAMINE 30 MG/ML IJ SOLN
INTRAMUSCULAR | Status: DC | PRN
  Administered 2017-07-28: 30 mg via INTRAVENOUS

## 2017-07-28 MED ORDER — SENNOSIDES-DOCUSATE SODIUM 8.6-50 MG PO TABS: 1.0000 | ORAL_TABLET | Freq: Two times a day (BID) | ORAL | 0 refills | Status: DC

## 2017-07-28 MED ORDER — OXYCODONE-ACETAMINOPHEN 5-325 MG PO TABS: 1.0000 | ORAL_TABLET | Freq: Four times a day (QID) | ORAL | 0 refills | Status: DC | PRN

## 2017-07-28 MED ORDER — FENTANYL CITRATE (PF) 100 MCG/2ML IJ SOLN
25.0000 ug | INTRAMUSCULAR | Status: DC | PRN
  Administered 2017-07-28: 25 ug via INTRAVENOUS
  Filled 2017-07-28: qty 1

## 2017-07-28 MED ORDER — OXYCODONE HCL 5 MG PO TABS
5.0000 mg | ORAL_TABLET | Freq: Once | ORAL | Status: DC | PRN
Start: 2017-07-28 — End: 2017-07-28
  Filled 2017-07-28: qty 1

## 2017-07-28 SURGICAL SUPPLY — 23 items
BAG DRAIN URO-CYSTO SKYTR STRL (DRAIN) ×2 IMPLANT
BASKET LASER NITINOL 1.9FR (BASKET) ×2 IMPLANT
CATH INTERMIT  6FR 70CM (CATHETERS) IMPLANT
CATH UROLOGY TORQUE 40 (MISCELLANEOUS) ×2 IMPLANT
CLOTH BEACON ORANGE TIMEOUT ST (SAFETY) ×2 IMPLANT
FIBER LASER FLEXIVA 365 (UROLOGICAL SUPPLIES) IMPLANT
FIBER LASER TRAC TIP (UROLOGICAL SUPPLIES) ×2 IMPLANT
GLOVE BIO SURGEON STRL SZ7.5 (GLOVE) ×2 IMPLANT
GOWN STRL REUS W/TWL LRG LVL3 (GOWN DISPOSABLE) ×2 IMPLANT
GUIDEWIRE ANG ZIPWIRE 038X150 (WIRE) ×2 IMPLANT
GUIDEWIRE STR DUAL SENSOR (WIRE) ×4 IMPLANT
INFUSOR MANOMETER BAG 3000ML (MISCELLANEOUS) ×2 IMPLANT
IV NS 1000ML (IV SOLUTION) ×1
IV NS 1000ML BAXH (IV SOLUTION) ×1 IMPLANT
IV NS IRRIG 3000ML ARTHROMATIC (IV SOLUTION) ×2 IMPLANT
KIT RM TURNOVER CYSTO AR (KITS) ×2 IMPLANT
MANIFOLD NEPTUNE II (INSTRUMENTS) ×2 IMPLANT
NS IRRIG 500ML POUR BTL (IV SOLUTION) ×2 IMPLANT
PACK CYSTO (CUSTOM PROCEDURE TRAY) ×2 IMPLANT
STENT POLARIS 5FRX26 (STENTS) ×2 IMPLANT
SYRINGE 10CC LL (SYRINGE) ×2 IMPLANT
TUBE CONNECTING 12X1/4 (SUCTIONS) ×2 IMPLANT
TUBE FEEDING 8FR 16IN STR KANG (MISCELLANEOUS) IMPLANT

## 2017-07-28 NOTE — Anesthesia Procedure Notes (Signed)
Procedure Name: LMA Insertion Date/Time: 07/28/2017 2:37 PM Performed by: Nolon Nations Pre-anesthesia Checklist: Patient identified, Emergency Drugs available, Suction available and Patient being monitored Patient Re-evaluated:Patient Re-evaluated prior to induction Oxygen Delivery Method: Circle system utilized Preoxygenation: Pre-oxygenation with 100% oxygen Induction Type: IV induction Ventilation: Mask ventilation without difficulty LMA: LMA inserted LMA Size: 5.0 Number of attempts: 1 Airway Equipment and Method: Bite block Placement Confirmation: positive ETCO2 Tube secured with: Tape Dental Injury: Teeth and Oropharynx as per pre-operative assessment

## 2017-07-28 NOTE — Discharge Instructions (Signed)
1 - You may have urinary urgency (bladder spasms) and bloody urine on / off with stent in place. This is normal. ° °2 - Call MD or go to ER for fever >102, severe pain / nausea / vomiting not relieved by medications, or acute change in medical status ° °[facility] °[phone] °Post Ureteroscopy With or Without Stent Instructions ° °Definitions: ° °Ureter: The duct that transports urine from the kidney to the bladder. °Stent:   A plastic hollow tube that is placed into the ureter, from the kidney to the  bladder to prevent the ureter from swelling shut. ° °GENERAL INSTRUCTIONS: ° °Despite the fact that no skin incisions were used, the area around the ureter and bladder is raw and irritated. The stent is a foreign body which will further irritate the bladder wall. This irritation is manifested by increased frequency of urination, both day and night, and by an increase in the urge to urinate. In some, the urge to urinate is present almost always. Sometimes the urge is strong enough that you may not be able to stop yourself from urinating. The only real cure is to remove the stent and then give time for the bladder wall to heal which can't be done until the danger of the ureter swelling shut has passed, which varies. ° °You may see some blood in your urine while the stent is in place and a few days afterwards. Do not be alarmed, even if the urine was clear for a while. Get off your feet and drink lots of fluids until clearing occurs. If you start to pass clots or don't improve, call us. ° °DIET: °You may return to your normal diet immediately. Because of the raw surface of your bladder, alcohol, spicy foods, acid type foods and drinks with caffeine may cause irritation or frequency and should be used in moderation. To keep your urine flowing freely and to avoid constipation, drink plenty of fluids during the day ( 8-10 glasses ). °Tip: Avoid cranberry juice because it is very  acidic. ° °ACTIVITY: °Your physical activity doesn't need to be restricted. However, if you are very active, you may see some blood in your urine. We suggest that you reduce your activity under these circumstances until the bleeding has stopped. ° °BOWELS: °It is important to keep your bowels regular during the postoperative period. Straining with bowel movements can cause bleeding. A bowel movement every other day is reasonable. Use a mild laxative if needed, such as Milk of Magnesia 2-3 tablespoons, or 2 Dulcolax tablets. Call if you continue to have problems. If you have been taking narcotics for pain, before, during or after your surgery, you may be constipated. Take a laxative if necessary. ° ° °MEDICATION: °You should resume your pre-surgery medications unless told not to. In addition you will often be given an antibiotic to prevent infection. These should be taken as prescribed until the bottles are finished unless you are having an unusual reaction to one of the drugs. ° °PROBLEMS YOU SHOULD REPORT TO US: °· Fevers over 100.5 Fahrenheit. °· Heavy bleeding, or clots ( See above notes about blood in urine ). °· Inability to urinate. °· Drug reactions ( hives, rash, nausea, vomiting, diarrhea ). °· Severe burning or pain with urination that is not improving. ° °FOLLOW-UP: °You will need a follow-up appointment to monitor your progress. Call for this appointment at the number listed above. Usually the first appointment will be about three to fourteen days after your surgery. ° ° ° ° ° °  Post Anesthesia Home Care Instructions ° °Activity: °Get plenty of rest for the remainder of the day. A responsible individual must stay with you for 24 hours following the procedure.  °For the next 24 hours, DO NOT: °-Drive a car °-Operate machinery °-Drink alcoholic beverages °-Take any medication unless instructed by your physician °-Make any legal decisions or sign important papers. ° °Meals: °Start with liquid foods such as  gelatin or soup. Progress to regular foods as tolerated. Avoid greasy, spicy, heavy foods. If nausea and/or vomiting occur, drink only clear liquids until the nausea and/or vomiting subsides. Call your physician if vomiting continues. ° °Special Instructions/Symptoms: °Your throat may feel dry or sore from the anesthesia or the breathing tube placed in your throat during surgery. If this causes discomfort, gargle with warm salt water. The discomfort should disappear within 24 hours. ° °If you had a scopolamine patch placed behind your ear for the management of post- operative nausea and/or vomiting: ° °1. The medication in the patch is effective for 72 hours, after which it should be removed.  Wrap patch in a tissue and discard in the trash. Wash hands thoroughly with soap and water. °2. You may remove the patch earlier than 72 hours if you experience unpleasant side effects which may include dry mouth, dizziness or visual disturbances. °3. Avoid touching the patch. Wash your hands with soap and water after contact with the patch. °  ° ° °

## 2017-07-28 NOTE — Interval H&P Note (Signed)
History and Physical Interval Note:  07/28/2017 1:57 PM  Jason Guzman  has presented today for surgery, with the diagnosis of LEFT URETERAL STONE  The various methods of treatment have been discussed with the patient and family. After consideration of risks, benefits and other options for treatment, the patient has consented to  Procedure(s): CYSTOSCOPY/URETEROSCOPY/HOLMIUM LASER/STENT PLACEMENT (Left) as a surgical intervention .  The patient's history has been reviewed, patient examined, no change in status, stable for surgery.  I have reviewed the patient's chart and labs.  Questions were answered to the patient's satisfaction.     Ammy Lienhard

## 2017-07-28 NOTE — Brief Op Note (Signed)
07/28/2017  3:24 PM  PATIENT:  [name]  61 y.o. male  PRE-OPERATIVE DIAGNOSIS:  LEFT URETERAL STONE  POST-OPERATIVE DIAGNOSIS:  LEFT URETERAL STONE  PROCEDURE:  Procedure(s): CYSTOSCOPY/URETEROSCOPY/RETROGRADE/HOLMIUM LASER/STENT PLACEMENT (Left)  SURGEON:  Surgeon(s) and Role:    * Alexis Frock, MD - Primary  PHYSICIAN ASSISTANT:   ASSISTANTS: none   ANESTHESIA:   general  EBL:  Total I/O In: 1600 [I.V.:1600] Out: 10 [Blood:10]  BLOOD ADMINISTERED:none  DRAINS: none   LOCAL MEDICATIONS USED:  NONE  SPECIMEN:  Source of Specimen:  left ureteral stone fragments  DISPOSITION OF SPECIMEN:  [facility] for compositional analysis  COUNTS:  YES  TOURNIQUET:  * No tourniquets in log *  DICTATION: .Other Dictation: Dictation Number [id number]  PLAN OF CARE: Discharge to home after PACU  PATIENT DISPOSITION:  PACU - hemodynamically stable.   Delay start of Pharmacological VTE agent (>24hrs) due to surgical blood loss or risk of bleeding: yes

## 2017-07-28 NOTE — Anesthesia Preprocedure Evaluation (Addendum)
Anesthesia Evaluation  Patient identified by MRN, date of birth, ID band Patient awake    Reviewed: Allergy & Precautions, NPO status , Patient's Chart, lab work & pertinent test results  Airway Mallampati: II  TM Distance: >3 FB Neck ROM: Full    Dental  (+) Edentulous Upper, Edentulous Lower   Pulmonary neg pulmonary ROS,    Pulmonary exam normal breath sounds clear to auscultation       Cardiovascular hypertension, Normal cardiovascular exam Rhythm:Regular Rate:Normal     Neuro/Psych negative neurological ROS  negative psych ROS   GI/Hepatic Neg liver ROS, GERD  ,  Endo/Other  negative endocrine ROS  Renal/GU negative Renal ROS     Musculoskeletal negative musculoskeletal ROS (+)   Abdominal   Peds  Hematology negative hematology ROS (+)   Anesthesia Other Findings   Reproductive/Obstetrics negative OB ROS                            Anesthesia Physical Anesthesia Plan  ASA: II  Anesthesia Plan: General   Post-op Pain Management:    Induction: Intravenous  PONV Risk Score and Plan: 3 and Ondansetron, Dexamethasone and Midazolam  Airway Management Planned: LMA  Additional Equipment:   Intra-op Plan:   Post-operative Plan: Extubation in OR  Informed Consent: I have reviewed the patients History and Physical, chart, labs and discussed the procedure including the risks, benefits and alternatives for the proposed anesthesia with the patient or authorized representative who has indicated his/her understanding and acceptance.   Dental advisory given  Plan Discussed with: CRNA  Anesthesia Plan Comments:         Anesthesia Quick Evaluation

## 2017-07-28 NOTE — Transfer of Care (Addendum)
Last Vitals:  Vitals:   07/28/17 1137  BP: (!) 143/71  Pulse: 69  Resp: 16  Temp: 37.1 C  SpO2: 99%    Last Pain:  Vitals:   07/28/17 1144  TempSrc:   PainSc: 6         Immediate Anesthesia Transfer of Care Note  Patient: Jason Guzman  Procedure(s) Performed: Procedure(s) (LRB): CYSTOSCOPY/URETEROSCOPY/RETROGRADE/HOLMIUM LASER/STENT PLACEMENT (Left)  Patient Location: PACU  Anesthesia Type: General  Level of Consciousness: drowsy  Airway & Oxygen Therapy: Patient Spontanous Breathing and Patient connected to nasal cannula oxygen, oral airway remaining in place.  Post-op Assessment: Report given to PACU RN and Post -op Vital signs reviewed and stable  Post vital signs: Reviewed and stable  Complications: No apparent anesthesia complications

## 2017-07-29 ENCOUNTER — Encounter (HOSPITAL_BASED_OUTPATIENT_CLINIC_OR_DEPARTMENT_OTHER): Payer: Self-pay | Admitting: Urology

## 2017-07-29 NOTE — Anesthesia Postprocedure Evaluation (Signed)
Anesthesia Post Note  Patient: Jason Guzman  Procedure(s) Performed: Procedure(s) (LRB): CYSTOSCOPY/URETEROSCOPY/RETROGRADE/HOLMIUM LASER/STENT PLACEMENT (Left)     Patient location during evaluation: PACU Anesthesia Type: General Level of consciousness: awake and alert Pain management: pain level controlled Vital Signs Assessment: post-procedure vital signs reviewed and stable Respiratory status: spontaneous breathing, nonlabored ventilation, respiratory function stable and patient connected to nasal cannula oxygen Cardiovascular status: blood pressure returned to baseline and stable Postop Assessment: no signs of nausea or vomiting Anesthetic complications: no    Last Vitals:  Vitals:   07/28/17 1615 07/28/17 1655  BP: 138/82 132/68  Pulse: 87 86  Resp: 17 16  Temp:  36.5 C  SpO2: 95% 94%    Last Pain:  Vitals:   07/28/17 1614  TempSrc:   PainSc: 4                  Ryan P Ellender

## 2017-07-29 NOTE — Op Note (Deleted)
  The note originally documented on this encounter has been moved the the encounter in which it belongs.  

## 2017-07-29 NOTE — Op Note (Signed)
NAME:  Jason Guzman, Jason Guzman                  ACCOUNT NO.:  [id number]  MEDICAL RECORD NO.:  [id number]  LOCATION:                                 FACILITY:  PHYSICIAN:  Alexis Frock, MD     DATE OF BIRTH:  [birth year]  DATE OF PROCEDURE: 07/28/2017                               OPERATIVE REPORT   DIAGNOSIS:  Left ureteral stone.  PROCEDURES: 1. Cystoscopy with left retrograde pyelogram interpretation. 2. Left ureteroscopy with laser lithotripsy. 3. Insertion of left ureteral stent, 5 x 26 Polaris, no tether.  ESTIMATED BLOOD LOSS:  Nil.  COMPLICATIONS:  None.  SPECIMENS:  Left distal ureteral stone fragments for compositional analysis.  FINDINGS: 1. Severe left ureteral edema at the area of the ureteral orifice. 2. Left distal ureteral stone with mild hydronephrosis. 3. Complete resolution of all stone fragments larger than 1/3rd mm     following laser lithotripsy on the left side. 4. Successful placement of left ureteral stent, proximal in the renal     pelvis and distal in the urinary bladder.  INDICATIONS:  Mr. Miranda is a pleasant 61 year old gentleman, who was found on workup of colicky flank pain to have a left distal ureteral stone at approximately 6 mm.  He had been on a trial of medical therapy, but his symptoms were refractory and he failed to pass the stone. Options were discussed for definitive management including continued medical therapy versus shockwave lithotripsy versus ureteroscopy and he wished to proceed with the latter.  Informed consent was obtained and placed in the medical record.  PROCEDURE IN DETAIL:  The patient being, [name], verified. Procedure being left ureteroscopic stone manipulation was confirmed. Procedure was carried out.  Time-out was performed.  Intravenous antibiotics were administered.  General anesthesia introduced.  The patient placed into a low lithotomy position.  Sterile field was created by prepping and draping the patient's  penis, perineum, proximal thighs using iodine.  Cystourethroscopy was performed using 22-French rigid cystoscope with offset lens.  Inspection of the anterior and posterior urethra were unremarkable.  Inspection of the bladder revealed no diverticula, calcifications, papillary lesions.  The left ureteral orifice was amazingly edematous, likely reflecting chronic inflammation from known underlying stone.  These changes appeared to be inflammatory and not of neoplastic origin.  Several attempts were made to cannulate the ureter with a 6-French open-ended catheter, but it was not successful.  As such, an 8 Imager-type angled tip catheter was used and the left ureteral orifice was cannulated and left retrograde pyelogram was obtained.  Left retrograde pyelogram demonstrated single left ureter with single system left kidney.  There was a mobile filling defect in distal ureter consistent with known stone.  There was mild hydroureteronephrosis above this.  A 0.038 Zip wire was advanced at the level of the upper pole, set aside as a safety wire and an 8-French feeding tube placed in the urinary bladder for pressure release and semi-rigid ureteroscopy was performed in the distal left ureter alongside a separate Sensor working wire.  As expected, the stone in question was encountered in the left ureter.  In the intramural ureter, it appeared to be too large for simple  basketing.  As such, holmium laser energy applied to the stone using settings of 0.3 joules and 30 Hz.  This fragmented into 4 smaller pieces, which were then sequentially grasped on the long axis and set aside in the urinary bladder.  Semi-rigid ureteroscopy of the entire length of the remainder of the left ureter revealed no additional calcifications or mucosal abnormalities.  The ureteroscope was then exchanged once again for the cystoscope and the ureteral stone fragments were irrigated and set aside for compositional analysis.   Given the impressive ureteral edema distally, it was felt that interval stenting would be warranted with non-tethered stent.  As such, a new 5 x 26 Polaris-type stent was placed over remaining safety wire using cystoscopic and fluoroscopic guidance.  Good proximal and distal deployment were noted.  Bladder was emptied per cystoscope.  Procedure was then terminated.  The patient tolerated the procedure well.  There were no immediate periprocedural complications.  The patient was taken to the postanesthesia care in stable condition.    ______________________________ Alexis Frock, MD   ______________________________ Alexis Frock, MD    TM/MEDQ  D:  07/28/2017  T:  07/29/2017  Job:  [id number]

## 2017-07-30 DIAGNOSIS — N201 Calculus of ureter: Secondary | ICD-10-CM | POA: Diagnosis not present

## 2017-08-09 DIAGNOSIS — R3915 Urgency of urination: Secondary | ICD-10-CM | POA: Diagnosis not present

## 2017-08-09 DIAGNOSIS — N201 Calculus of ureter: Secondary | ICD-10-CM | POA: Diagnosis not present

## 2017-08-16 DIAGNOSIS — K621 Rectal polyp: Secondary | ICD-10-CM | POA: Diagnosis not present

## 2017-08-16 DIAGNOSIS — D126 Benign neoplasm of colon, unspecified: Secondary | ICD-10-CM | POA: Diagnosis not present

## 2017-08-16 DIAGNOSIS — Z86010 Personal history of colonic polyps: Secondary | ICD-10-CM | POA: Diagnosis not present

## 2017-09-20 DIAGNOSIS — R739 Hyperglycemia, unspecified: Secondary | ICD-10-CM | POA: Diagnosis not present

## 2017-09-20 DIAGNOSIS — K219 Gastro-esophageal reflux disease without esophagitis: Secondary | ICD-10-CM | POA: Diagnosis not present

## 2017-09-20 DIAGNOSIS — E7800 Pure hypercholesterolemia, unspecified: Secondary | ICD-10-CM | POA: Diagnosis not present

## 2017-09-20 DIAGNOSIS — I10 Essential (primary) hypertension: Secondary | ICD-10-CM | POA: Diagnosis not present

## 2017-11-13 DIAGNOSIS — H04123 Dry eye syndrome of bilateral lacrimal glands: Secondary | ICD-10-CM | POA: Diagnosis not present

## 2017-11-13 DIAGNOSIS — H40033 Anatomical narrow angle, bilateral: Secondary | ICD-10-CM | POA: Diagnosis not present

## 2018-02-21 DIAGNOSIS — I10 Essential (primary) hypertension: Secondary | ICD-10-CM | POA: Diagnosis not present

## 2018-02-21 DIAGNOSIS — Z125 Encounter for screening for malignant neoplasm of prostate: Secondary | ICD-10-CM | POA: Diagnosis not present

## 2018-02-21 DIAGNOSIS — Z0000 Encounter for general adult medical examination without abnormal findings: Secondary | ICD-10-CM | POA: Diagnosis not present

## 2018-06-23 DIAGNOSIS — R221 Localized swelling, mass and lump, neck: Secondary | ICD-10-CM | POA: Diagnosis not present

## 2018-09-05 DIAGNOSIS — N529 Male erectile dysfunction, unspecified: Secondary | ICD-10-CM | POA: Diagnosis not present

## 2018-09-05 DIAGNOSIS — I10 Essential (primary) hypertension: Secondary | ICD-10-CM | POA: Diagnosis not present

## 2018-09-05 DIAGNOSIS — K219 Gastro-esophageal reflux disease without esophagitis: Secondary | ICD-10-CM | POA: Diagnosis not present

## 2018-11-14 DIAGNOSIS — R109 Unspecified abdominal pain: Secondary | ICD-10-CM | POA: Diagnosis not present

## 2018-11-17 ENCOUNTER — Other Ambulatory Visit: Payer: Self-pay | Admitting: Family Medicine

## 2018-11-17 DIAGNOSIS — R222 Localized swelling, mass and lump, trunk: Secondary | ICD-10-CM

## 2018-11-17 DIAGNOSIS — R109 Unspecified abdominal pain: Secondary | ICD-10-CM

## 2018-11-24 ENCOUNTER — Ambulatory Visit
Admission: RE | Admit: 2018-11-24 | Discharge: 2018-11-24 | Disposition: A | Discharge status: Home / Self Care | Payer: 59 | Source: Ambulatory Visit | Attending: Family Medicine | Admitting: Family Medicine

## 2018-11-24 DIAGNOSIS — R109 Unspecified abdominal pain: Secondary | ICD-10-CM

## 2018-11-24 DIAGNOSIS — R222 Localized swelling, mass and lump, trunk: Secondary | ICD-10-CM

## 2018-11-24 DIAGNOSIS — R1900 Intra-abdominal and pelvic swelling, mass and lump, unspecified site: Secondary | ICD-10-CM | POA: Diagnosis not present

## 2019-02-20 DIAGNOSIS — R1030 Lower abdominal pain, unspecified: Secondary | ICD-10-CM | POA: Diagnosis not present

## 2019-02-20 DIAGNOSIS — R29898 Other symptoms and signs involving the musculoskeletal system: Secondary | ICD-10-CM | POA: Diagnosis not present

## 2019-02-27 DIAGNOSIS — E7800 Pure hypercholesterolemia, unspecified: Secondary | ICD-10-CM | POA: Diagnosis not present

## 2019-02-27 DIAGNOSIS — R1030 Lower abdominal pain, unspecified: Secondary | ICD-10-CM | POA: Diagnosis not present

## 2019-02-27 DIAGNOSIS — Z0000 Encounter for general adult medical examination without abnormal findings: Secondary | ICD-10-CM | POA: Diagnosis not present

## 2019-02-27 DIAGNOSIS — R29898 Other symptoms and signs involving the musculoskeletal system: Secondary | ICD-10-CM | POA: Diagnosis not present

## 2019-02-27 DIAGNOSIS — Z13220 Encounter for screening for lipoid disorders: Secondary | ICD-10-CM | POA: Diagnosis not present

## 2019-05-15 ENCOUNTER — Other Ambulatory Visit: Payer: Self-pay | Admitting: Family Medicine

## 2019-05-15 DIAGNOSIS — R1030 Lower abdominal pain, unspecified: Secondary | ICD-10-CM

## 2019-05-29 IMAGING — CT CT RENAL STONE PROTOCOL
2 of 4 series · 16 of 46 positions shown, 18 images · non-contrast
Comparison: CT of the abdomen and pelvis performed 08/31/2011

CLINICAL DATA: Acute onset of left flank pain and dysuria. Initial
encounter.

EXAM:
CT ABDOMEN AND PELVIS WITHOUT CONTRAST
TECHNIQUE: Multidetector CT imaging of the abdomen and pelvis was performed
following the standard protocol without IV contrast.

[Series 3: ap without · axial · non-contrast · 0.81mm/px · z∈[+642,+1082]mm · 13 of 100 slices shown, 15 images]
[im 6/100  soft-tissue]
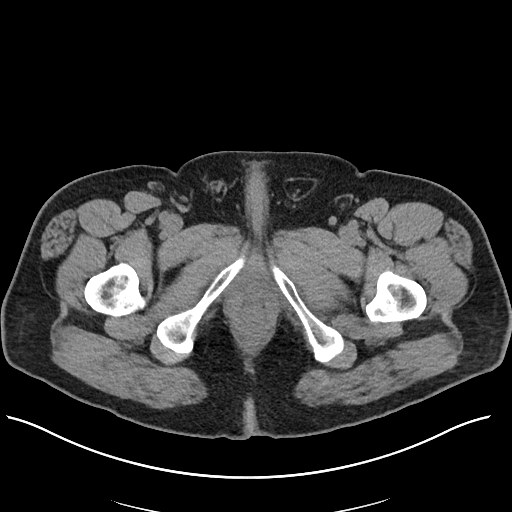
[im 6/100  bone]
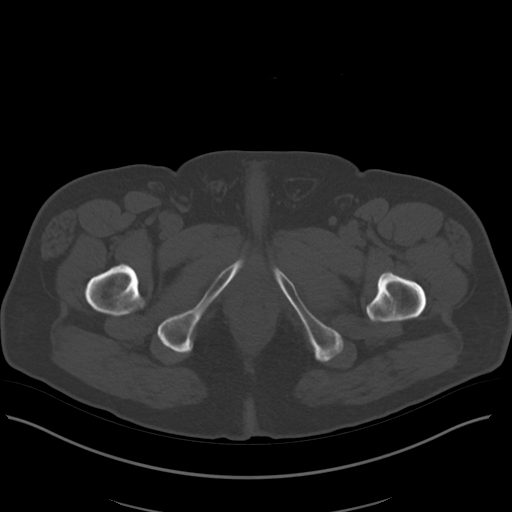
[im 12/100  soft-tissue]
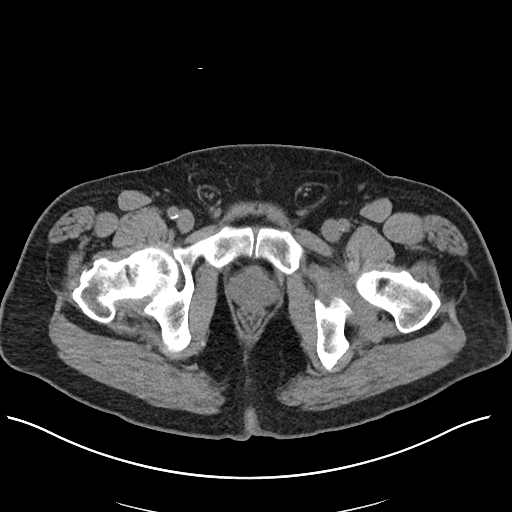
[im 23/100  soft-tissue]
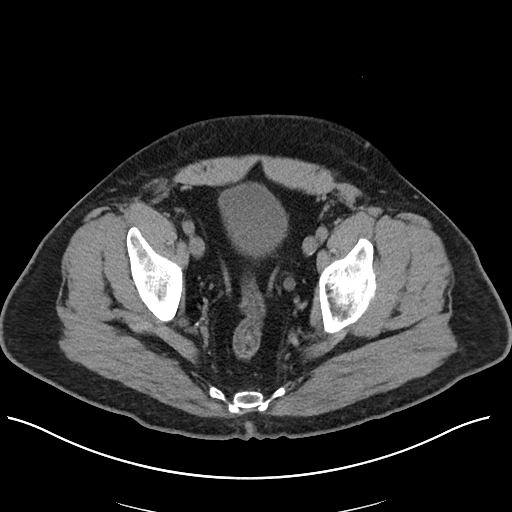
[im 28/100  soft-tissue]
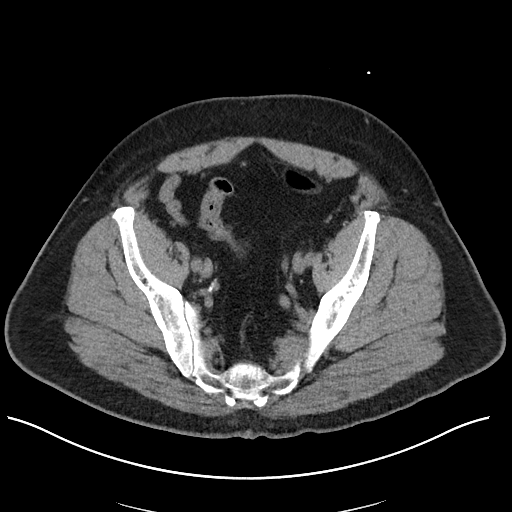
[im 34/100  soft-tissue]
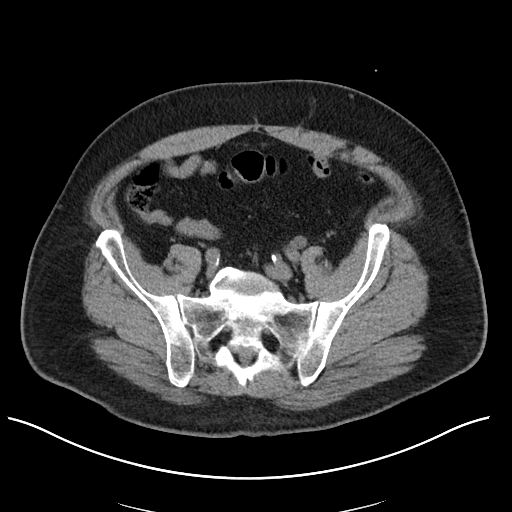
[im 45/100  soft-tissue]
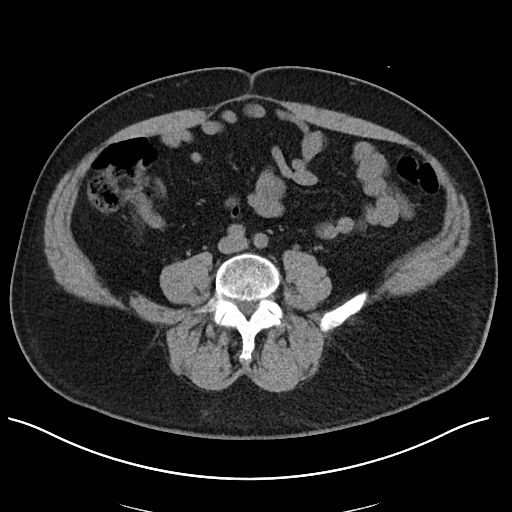
[im 50/100  soft-tissue]
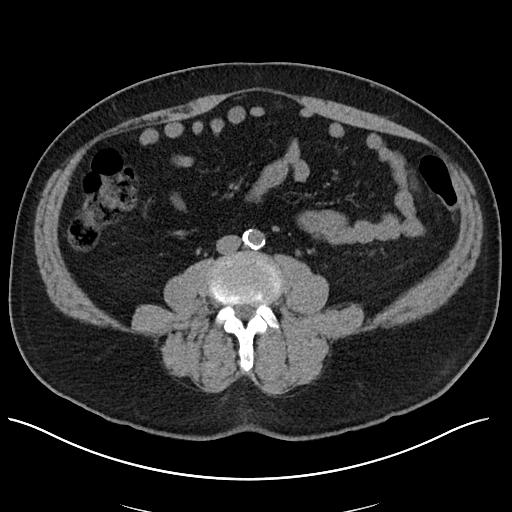
[im 56/100  soft-tissue]
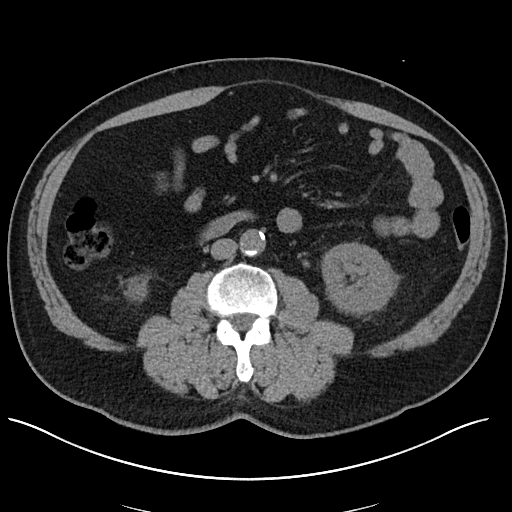
[im 67/100  soft-tissue]
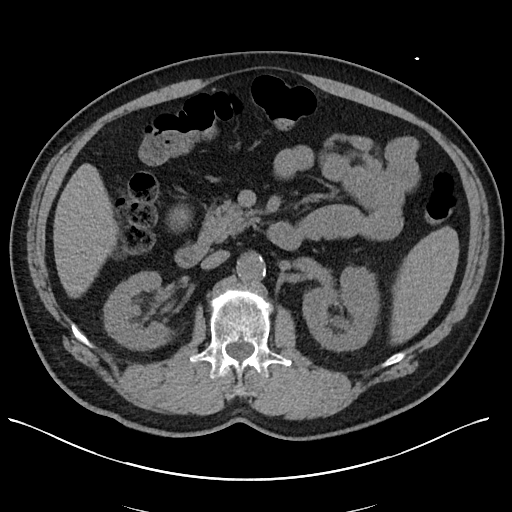
[im 67/100  bone]
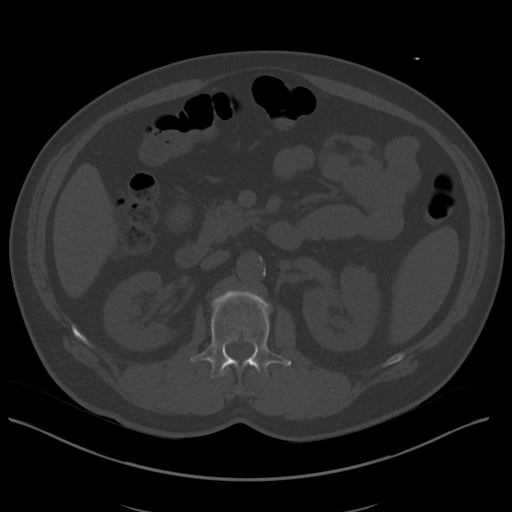
[im 72/100  soft-tissue]
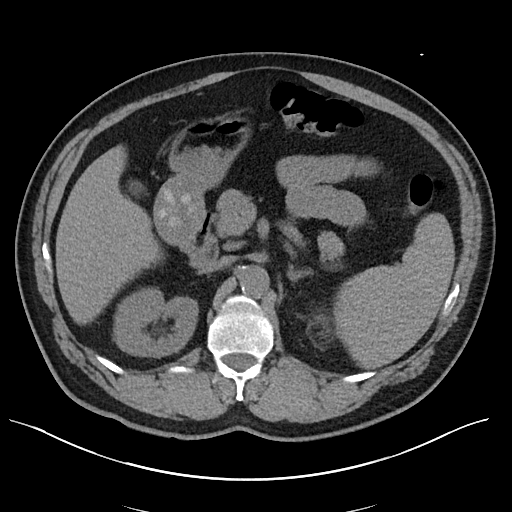
[im 78/100  soft-tissue]
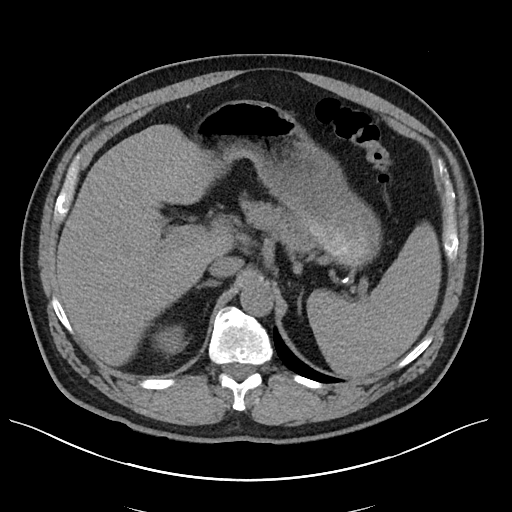
[im 89/100  soft-tissue]
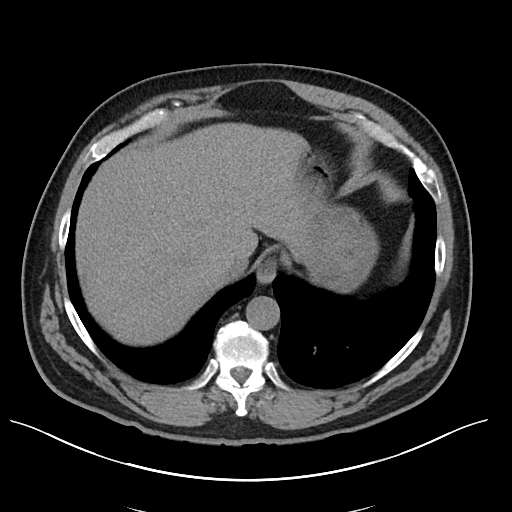
[im 94/100  soft-tissue]
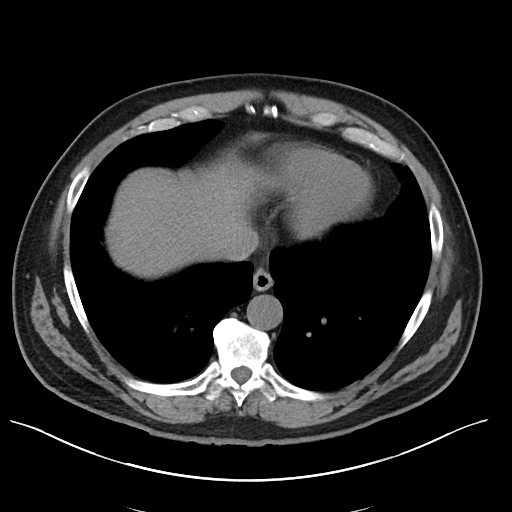

[Series 6: cor · coronal · 0.93mm/px · 3 of 112 slices shown]
[im 38/112  soft-tissue]
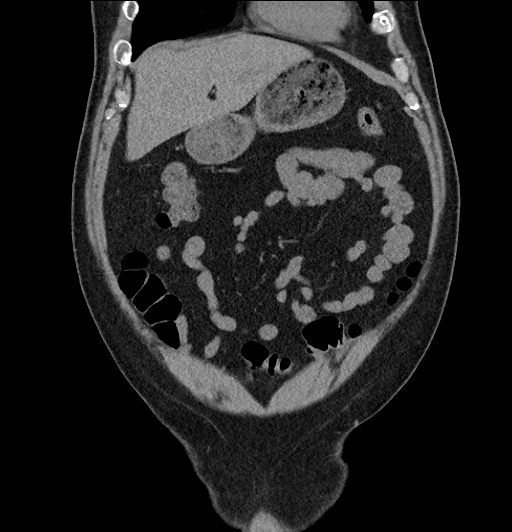
[im 50/112  soft-tissue]
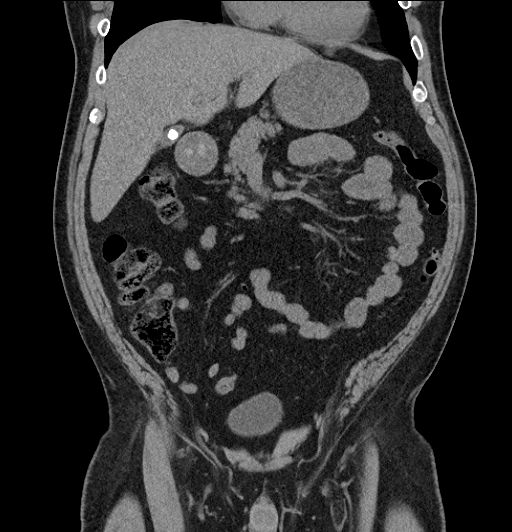
[im 62/112  soft-tissue]
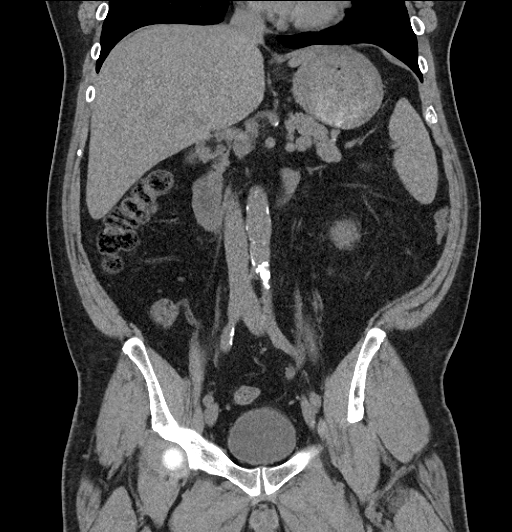

[16 of 46 positions shown; findings below may reference images not displayed]

FINDINGS: Lower chest: The visualized lung bases are grossly clear. The
visualized portions of the mediastinum are unremarkable.

Hepatobiliary: The liver is unremarkable in appearance. A stone is
noted within the gallbladder. The gallbladder is otherwise
unremarkable in appearance. The common bile duct remains normal in
caliber.

Pancreas: The pancreas is within normal limits.

Spleen: The spleen is unremarkable in appearance.

Adrenals/Urinary Tract: The adrenal glands are unremarkable in
appearance.

There is minimal left-sided hydronephrosis, with left-sided
perinephric stranding. An obstructing 6 x 6 mm stone is noted at the
left side of the base of the bladder, at the left vesicoureteral
junction. No nonobstructing renal stones are identified.

Stomach/Bowel: The stomach is unremarkable in appearance. The small
bowel is within normal limits. The appendix is normal in caliber,
without evidence of appendicitis. The colon is unremarkable in
appearance.

Vascular/Lymphatic: Scattered calcification is seen along the
abdominal aorta and its branches. The abdominal aorta is otherwise
grossly unremarkable. The inferior vena cava is grossly
unremarkable. No retroperitoneal lymphadenopathy is seen. No pelvic
sidewall lymphadenopathy is identified.

Reproductive: The bladder is mildly distended and otherwise
unremarkable. The prostate is normal in size.

Other: No additional soft tissue abnormalities are seen.

Musculoskeletal: No acute osseous abnormalities are identified. The
visualized musculature is unremarkable in appearance.
IMPRESSION: 1. Minimal left-sided hydronephrosis, with an obstructing 6 x 6 mm
stone at the left side of the base of the bladder, at the left
vesicoureteral junction.
2. Scattered aortic atherosclerosis.
3. Cholelithiasis.  Gallbladder otherwise unremarkable.

## 2019-05-30 ENCOUNTER — Ambulatory Visit
Admission: RE | Admit: 2019-05-30 | Discharge: 2019-05-30 | Disposition: A | Discharge status: Home / Self Care | Payer: 59 | Source: Ambulatory Visit | Attending: Family Medicine | Admitting: Family Medicine

## 2019-05-30 DIAGNOSIS — R1030 Lower abdominal pain, unspecified: Secondary | ICD-10-CM

## 2019-05-30 MED ORDER — IOPAMIDOL (ISOVUE-300) INJECTION 61%
100.0000 mL | Freq: Once | INTRAVENOUS | Status: AC | PRN
  Administered 2019-05-30: 100 mL via INTRAVENOUS

## 2019-12-01 HISTORY — PX: ANTERIOR CERVICAL DECOMP/DISCECTOMY FUSION: SHX1161

## 2019-12-07 ENCOUNTER — Other Ambulatory Visit: Payer: Self-pay | Admitting: Chiropractor

## 2019-12-07 ENCOUNTER — Ambulatory Visit
Admission: RE | Admit: 2019-12-07 | Discharge: 2019-12-07 | Disposition: A | Discharge status: Home / Self Care | Payer: 59 | Source: Ambulatory Visit | Attending: Chiropractor | Admitting: Chiropractor

## 2019-12-07 DIAGNOSIS — M542 Cervicalgia: Secondary | ICD-10-CM

## 2021-05-17 ENCOUNTER — Other Ambulatory Visit: Payer: Self-pay

## 2021-05-17 ENCOUNTER — Encounter (HOSPITAL_COMMUNITY): Payer: Self-pay | Admitting: *Deleted

## 2021-05-17 ENCOUNTER — Emergency Department (HOSPITAL_COMMUNITY)
Admission: EM | Admit: 2021-05-17 | Discharge: 2021-05-18 | Disposition: A | Discharge status: Home / Self Care | Payer: 59 | Attending: Emergency Medicine | Admitting: Emergency Medicine

## 2021-05-17 DIAGNOSIS — R3129 Other microscopic hematuria: Secondary | ICD-10-CM

## 2021-05-17 DIAGNOSIS — R21 Rash and other nonspecific skin eruption: Secondary | ICD-10-CM | POA: Diagnosis not present

## 2021-05-17 DIAGNOSIS — R350 Frequency of micturition: Secondary | ICD-10-CM | POA: Insufficient documentation

## 2021-05-17 DIAGNOSIS — R300 Dysuria: Secondary | ICD-10-CM

## 2021-05-17 DIAGNOSIS — Z79899 Other long term (current) drug therapy: Secondary | ICD-10-CM | POA: Insufficient documentation

## 2021-05-17 DIAGNOSIS — I10 Essential (primary) hypertension: Secondary | ICD-10-CM | POA: Insufficient documentation

## 2021-05-17 LAB — URINALYSIS, COMPLETE (UACMP) WITH MICROSCOPIC
Bacteria, UA: NONE SEEN
Bilirubin Urine: NEGATIVE
Glucose, UA: NEGATIVE mg/dL
Ketones, ur: 5 mg/dL — AB
Leukocytes,Ua: NEGATIVE
Nitrite: NEGATIVE
Protein, ur: NEGATIVE mg/dL
RBC / HPF: >50 RBC/hpf — ABNORMAL HIGH (ref 0–5)
Specific Gravity, Urine: 1.023 (ref 1.005–1.030)
pH: 5 (ref 5.0–8.0)

## 2021-05-17 NOTE — ED Triage Notes (Signed)
The pt is having stinging in his head for 2 days  it was first in his testicles but is gone now

## 2021-05-17 NOTE — ED Provider Notes (Signed)
Emergency Medicine Provider Triage Evaluation Note  Jason Guzman , a 65 y.o. male  was evaluated in triage.  Pt complains of 2 days of burning stinging pain to his testicles, started on the left and migrated to the right.  That has resolved this time, but he does have burning stinging pain to the penile head without discharge.  Endorses urinary urgency and frequency, though states he urinates very little amounts very frequently which is not normal for him.  He denies any abdominal pain that is new, nausea, vomiting, diarrhea, fevers, or chills.  He is sexually active with women but denies any recent exposure to sexually transmitted infection to his knowledge.  He does endorse erythematous burning rash to the right medial thigh and scrotum..  Review of Systems  Positive: Penile pain, scrotal pain and rash, urinary frequency, urgency Negative: Penile discharge, dysuria  Physical Exam  BP (!) 157/80 (BP Location: Right Arm)   Pulse 70   Temp 98.1 F (36.7 C) (Oral)   Resp 18   Ht 5\' 10"  (1.778 m)   Wt 101.4 kg   SpO2 (!) 89%   BMI 32.08 kg/m  Gen:   Awake, no distress   Resp:  Normal effort  MSK:   Moves extremities without difficulty  Other:  Afebrile  Medical Decision Making  Medically screening exam initiated at 10:10 PM.  Appropriate orders placed.  [name] was informed that the remainder of the evaluation will be completed by another provider, this initial triage assessment does not replace that evaluation, and the importance of remaining in the ED until their evaluation is complete.  This chart was dictated using voice recognition software, Dragon. Despite the best efforts of this provider to proofread and correct errors, errors may still occur which can change documentation meaning.    Aura Dials 05/17/21 2235    Arnaldo Natal, MD 05/18/21 1216

## 2021-05-18 NOTE — ED Provider Notes (Signed)
Jason Guzman  CSN: [id number] Arrival date & time: 05/17/21 2138  Chief Complaint(s) Penis Pain  HPI Jason Guzman is a 65 y.o. male here with 2 days of burning stinging pain to his testicles, started on the left and migrated to the right.  That has resolved this time, but he does have burning stinging pain to the penile head without discharge.  Endorses urinary urgency and frequency, though states he urinates very little amounts very frequently which is not normal for him.  He denies any abdominal pain that is new, nausea, vomiting, diarrhea, fevers, or chills.  He is sexually active with women but denies any recent exposure to sexually transmitted infection to his knowledge.  He does endorse erythematous burning rash to the right medial thigh and scrotum.  Pain is similar to prior renal stone pain.  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 Authorizing Provider  atorvastatin (LIPITOR) 20 MG tablet Take 20 mg by mouth every evening.  06/09/17   [provider]  cephALEXin (KEFLEX) 500 MG capsule Take 1 capsule (500 mg total) by mouth 2 (two) times daily. X 3 days. Begin day before next Urology appointment. 07/28/17   Alexis Frock, MD  ketorolac (TORADOL) 10 MG tablet Take 1 tablet  (10 mg total) by mouth every 6 (six) hours as needed. For mild pain / stent discomfort post-operatively (pt has tolerated this med prior) 07/28/17   Alexis Frock, MD  lisinopril (PRINIVIL,ZESTRIL) 10 MG tablet Take 10 mg by mouth every evening.  06/10/17   [provider]  omeprazole (PRILOSEC) 20 MG capsule Take 20 mg by mouth as needed.    [provider]  ondansetron (ZOFRAN ODT) 4 MG disintegrating tablet Take 1 tablet (4 mg total) by mouth every 8 (eight) hours as needed for nausea or vomiting. 07/11/17   Rancour, Annie Main, MD  oxyCODONE-acetaminophen (PERCOCET/ROXICET) 5-325 MG tablet Take 1-2 tablets by mouth every 6 (six) hours as needed for severe pain. From kidney stone 07/28/17   Alexis Frock, MD  senna-docusate (SENOKOT-S) 8.6-50 MG tablet Take 1 tablet by mouth 2 (two) times daily. While taking strongest pain meds to prevent constipation. 07/28/17   Alexis Frock, MD  Past Surgical History Past Surgical History:  Procedure Laterality Date   CLOSED REDUCTION AND EXTERNAL FIXATOR FOR COMPLEX FRACTURE  11/26/2005  dr Eddie Dibbles at [facility]   complex radial head fx dislocation ligamentous injury (elbow)   COLONOSCOPY  last one 08-20-2014   CYSTOSCOPY/URETEROSCOPY/HOLMIUM LASER/STENT PLACEMENT Left 07/28/2017   Procedure: CYSTOSCOPY/URETEROSCOPY/RETROGRADE/HOLMIUM LASER/STENT PLACEMENT;  Surgeon: Alexis Frock, MD;  Location: [facility];  Service: Urology;  Laterality: Left;   ESOPHAGOGASTRODUODENOSCOPY  09/07/2011   REMOVAL EXTERNAL FIXATOR/ RADICAL HEAD ARTHROPLASTY/ LIGAMENT REPAIR/ TREATMENT DISLOCATON Left 12/08/2005   dr handy at [facility]   Family History No family history on file.  Social History Social History   Tobacco Use   Smoking status: Never   Smokeless tobacco: Never  Vaping Use   Vaping Use: Never used  Substance  Use Topics   Alcohol use: No   Drug use: No   Allergies Patient has no known allergies.  Review of Systems Review of Systems All other systems are reviewed and are negative for acute change except as noted in the HPI  Physical Exam Vital Signs  I have reviewed the triage vital signs BP (!) 148/74 (BP Location: Right Arm)   Pulse 63   Temp 98.1 F (36.7 C) (Oral)   Resp 12   Ht 5\' 10"  (1.778 m)   Wt 101.4 kg   SpO2 99%   BMI 32.08 kg/m   Physical Exam Vitals reviewed.  Constitutional:      General: He is not in acute distress.    Appearance: He is well-developed. He is not diaphoretic.  HENT:     Head: Normocephalic and atraumatic.     Right Ear: External ear normal.     Left Ear: External ear normal.     Nose: Nose normal.     Mouth/Throat:     Mouth: Mucous membranes are moist.  Eyes:     General: No scleral icterus.    Conjunctiva/sclera: Conjunctivae normal.  Neck:     Trachea: Phonation normal.  Cardiovascular:     Rate and Rhythm: Normal rate and regular rhythm.  Pulmonary:     Effort: Pulmonary effort is normal. No respiratory distress.     Breath sounds: No stridor.  Abdominal:     General: There is no distension.     Hernia: There is no hernia in the left inguinal area or right inguinal area.  Genitourinary:    Penis: Circumcised. No paraphimosis, erythema, tenderness, discharge, swelling or lesions.      Testes: Normal. Cremasteric reflex is present.        Right: Tenderness or swelling not present.        Left: Tenderness or swelling not present.     Epididymis:     Right: No tenderness.     Left: No tenderness.  Musculoskeletal:        General: Normal range of motion.     Cervical back: Normal range of motion.  Neurological:     Mental Status: He is alert and oriented to person, place, and time.  Psychiatric:        Behavior: Behavior normal.    ED Results and Treatments Labs (all labs ordered are listed, but only abnormal results are  displayed) Labs Reviewed  URINALYSIS, COMPLETE (UACMP) WITH MICROSCOPIC - Abnormal; Notable for the following components:      Result Value   APPearance HAZY (*)    Hgb urine dipstick SMALL (*)    Ketones, ur 5 (*)  RBC / HPF >50 (*)    All other components within normal limits  URINE CULTURE  GC/CHLAMYDIA PROBE AMP ([place]) NOT AT [facility]                                                                                                                         EKG  EKG Interpretation  Date/Time:    Ventricular Rate:    PR Interval:    QRS Duration:   QT Interval:    QTC Calculation:   R Axis:     Text Interpretation:          Radiology No results found.  Pertinent labs & imaging results that were available during my care of the patient were reviewed by me and considered in my medical decision making (see chart for details).  Medications Ordered in ED Medications - No data to display                                                                                                                                  Procedures Procedures  (including critical care time)  Medical Decision Making / ED Course I have reviewed the nursing notes for this encounter and the patient's prior records (if available in EHR or on provided paperwork).   Jason Guzman was evaluated in Emergency Department on 05/18/2021 for the symptoms described in the history of present illness. He was evaluated in the context of the global COVID-19 pandemic, which necessitated consideration that the patient might be at risk for infection with the SARS-CoV-2 virus that causes COVID-19. Institutional protocols and algorithms that pertain to the evaluation of patients at risk for COVID-19 are in a state of rapid change based on information released by regulatory bodies including the CDC and federal and state organizations. These policies and algorithms were followed during the patient's care in the ED.  Patient  presents with penile pain.  UA with hematuria.  Possibly small renal stones.  Pain is controlled at this time.  UA without evidence of infection.  No evidence of urethritis on exam concerning for GC/chlamydia but culture sent.  Recommended PCP/urology follow-up for continued symptoms      Final Clinical Impression(s) / ED Diagnoses Final diagnoses:  Dysuria  Other microscopic hematuria    The patient appears reasonably screened and/or stabilized for discharge and I doubt any other medical condition or other [facility] requiring further screening,  evaluation, or treatment in the ED at this time prior to discharge. Safe for discharge with strict return precautions.  Disposition: Discharge  Condition: Good  I have discussed the results, Dx and Tx plan with the patient/family who expressed understanding and agree(s) with the plan. Discharge instructions discussed at length. The patient/family was given strict return precautions who verbalized understanding of the instructions. No further questions at time of discharge.    ED Discharge Orders     None        Follow Up: Marda Stalker, PA-C [place] [address] [phone]  Call  if symptoms do not improve or  worsen    This chart was dictated using voice recognition software.  Despite best efforts to proofread,  errors can occur which can change the documentation meaning.    Fatima Blank, MD 05/18/21 [phone]

## 2021-05-19 LAB — URINE CULTURE: Culture: NO GROWTH

## 2021-09-16 ENCOUNTER — Other Ambulatory Visit: Payer: Self-pay | Admitting: Nurse Practitioner

## 2021-09-16 ENCOUNTER — Ambulatory Visit
Admission: RE | Admit: 2021-09-16 | Discharge: 2021-09-16 | Disposition: A | Discharge status: Home / Self Care | Payer: Worker's Compensation | Source: Ambulatory Visit | Attending: Nurse Practitioner | Admitting: Nurse Practitioner

## 2021-09-16 DIAGNOSIS — R52 Pain, unspecified: Secondary | ICD-10-CM

## 2021-10-30 HISTORY — PX: SHOULDER ARTHROSCOPY W/ ROTATOR CUFF REPAIR: SHX2400

## 2021-11-30 HISTORY — PX: ANTERIOR CERVICAL DECOMP/DISCECTOMY FUSION: SHX1161

## 2022-04-20 ENCOUNTER — Other Ambulatory Visit: Payer: Self-pay

## 2022-04-20 ENCOUNTER — Emergency Department (HOSPITAL_COMMUNITY)
Admission: EM | Admit: 2022-04-20 | Discharge: 2022-04-21 | Disposition: A | Discharge status: Home / Self Care | Payer: 59 | Attending: Emergency Medicine | Admitting: Emergency Medicine

## 2022-04-20 ENCOUNTER — Encounter (HOSPITAL_COMMUNITY): Payer: Self-pay | Admitting: Emergency Medicine

## 2022-04-20 ENCOUNTER — Emergency Department (HOSPITAL_COMMUNITY): Payer: 59

## 2022-04-20 DIAGNOSIS — M25512 Pain in left shoulder: Secondary | ICD-10-CM | POA: Diagnosis not present

## 2022-04-20 DIAGNOSIS — I10 Essential (primary) hypertension: Secondary | ICD-10-CM | POA: Diagnosis not present

## 2022-04-20 DIAGNOSIS — M5412 Radiculopathy, cervical region: Secondary | ICD-10-CM | POA: Diagnosis not present

## 2022-04-20 DIAGNOSIS — Z79899 Other long term (current) drug therapy: Secondary | ICD-10-CM | POA: Insufficient documentation

## 2022-04-20 DIAGNOSIS — M542 Cervicalgia: Secondary | ICD-10-CM | POA: Diagnosis present

## 2022-04-20 NOTE — ED Triage Notes (Signed)
Patient reports persistent pain at left shoulder radiating to left lateral neck for several weeks unrelieved by prescription pain medication , denies injury or fall , pain increases when lying on bed.

## 2022-04-20 NOTE — ED Provider Triage Note (Signed)
Emergency Medicine Provider Triage Evaluation Note  Jason Guzman , a 66 y.o. male  was evaluated in triage.  Pt complains of neck pain radiating to the left shoulder.  Symptoms have been going on for the past 3 weeks but have gradually worsened.  Was on a course of prednisone since last week but continues to have pain.  History of cervical fusion about 2 years ago.  Denies any numbness or weakness, headache.  Review of Systems  Positive: Neck pain Negative: Numbness, weakness, headache  Physical Exam  BP (!) 150/79 (BP Location: Right Arm)   Pulse (!) 58   Temp 98.2 F (36.8 C) (Oral)   Resp 19   Ht '5\' 10"'$  (1.778 m)   Wt 110 kg   SpO2 95%   BMI 34.80 kg/m  Gen:   Awake, no distress   Resp:  Normal effort  MSK:   Moves extremities without difficulty  Other:  Tenderness palpation of the cervical spine at the midline and left paraspinal musculature.  Equal grip strength bilaterally  Medical Decision Making  Medically screening exam initiated at 9:17 PM.  Appropriate orders placed.  Jason Guzman was informed that the remainder of the evaluation will be completed by another provider, this initial triage assessment does not replace that evaluation, and the importance of remaining in the ED until their evaluation is complete.  We will order imaging due to prior surgeries   Jason Heady, PA-C 04/20/22 2118

## 2022-04-21 MED ORDER — OXYCODONE-ACETAMINOPHEN 5-325 MG PO TABS
1.0000 | ORAL_TABLET | Freq: Once | ORAL | Status: AC
  Administered 2022-04-21: 1 via ORAL
  Filled 2022-04-21: qty 1

## 2022-04-21 MED ORDER — KETOROLAC TROMETHAMINE 30 MG/ML IJ SOLN
30.0000 mg | Freq: Once | INTRAMUSCULAR | Status: AC
  Administered 2022-04-21: 30 mg via INTRAMUSCULAR
  Filled 2022-04-21: qty 1

## 2022-04-21 MED ORDER — OXYCODONE-ACETAMINOPHEN 5-325 MG PO TABS: 1.0000 | ORAL_TABLET | Freq: Four times a day (QID) | ORAL | 0 refills | Status: DC | PRN

## 2022-04-21 MED ORDER — METHYLPREDNISOLONE 4 MG PO TBPK: ORAL_TABLET | ORAL | 0 refills | Status: DC

## 2022-04-21 NOTE — Discharge Instructions (Addendum)
You were seen today for ongoing neck pain that radiates into your arm.  This is consistent with nerve pain.  Follow-up with Dr. Lynann Bologna.  Take medications as prescribed.  If you develop weakness or numbness in the hand or arm, this would be reason to be reevaluated more emergently.

## 2022-04-21 NOTE — ED Provider Notes (Signed)
[facility] EMERGENCY DEPARTMENT Provider Note   CSN: [id number] Arrival date & time: 04/20/22  2001     History  Chief Complaint  Patient presents with   Shoulder Pain     ELISHAH ASHMORE is a 66 y.o. male.  HPI     This is a 66 year old male who presents with left shoulder neck pain.  Patient reports he has had 2 to 3-week history of worsening pain in the left shoulder and neck.  He states when he turns his head to the right he has radiation of pain from his neck down into his arm.  He has not had any weakness, numbness, tingling of that arm.  He has been on a course of prednisone since Wednesday but states that that has not seemed to help.  He has a history of cervical fusion by Dr. Lynann Bologna 2 years ago.  Home Medications Prior to Admission medications   Medication Sig Start Date End Date Taking? Authorizing Provider  atorvastatin (LIPITOR) 20 MG tablet Take 20 mg by mouth every evening.  06/09/17   [provider]  cephALEXin (KEFLEX) 500 MG capsule Take 1 capsule (500 mg total) by mouth 2 (two) times daily. X 3 days. Begin day before next Urology appointment. 07/28/17   Alexis Frock, MD  ketorolac (TORADOL) 10 MG tablet Take 1 tablet (10 mg total) by mouth every 6 (six) hours as needed. For mild pain / stent discomfort post-operatively (pt has tolerated this med prior) 07/28/17   Alexis Frock, MD  lisinopril (PRINIVIL,ZESTRIL) 10 MG tablet Take 10 mg by mouth every evening.  06/10/17   [provider]  omeprazole (PRILOSEC) 20 MG capsule Take 20 mg by mouth as needed.    [provider]  ondansetron (ZOFRAN ODT) 4 MG disintegrating tablet Take 1 tablet (4 mg total) by mouth every 8 (eight) hours as needed for nausea or vomiting. 07/11/17   Rancour, Annie Main, MD  oxyCODONE-acetaminophen (PERCOCET/ROXICET) 5-325 MG tablet Take 1-2 tablets by mouth every 6 (six) hours as needed for severe pain. From kidney stone 07/28/17   Alexis Frock, MD   senna-docusate (SENOKOT-S) 8.6-50 MG tablet Take 1 tablet by mouth 2 (two) times daily. While taking strongest pain meds to prevent constipation. 07/28/17   Alexis Frock, MD      Allergies    Patient has no known allergies.    Review of Systems   Review of Systems  Musculoskeletal:  Positive for neck pain.  Neurological:  Negative for weakness and numbness.  All other systems reviewed and are negative.  Physical Exam Updated Vital Signs BP (!) 165/77   Pulse 60   Temp 98.2 F (36.8 C) (Oral)   Resp (!) 23   Ht 1.778 m ('5\' 10"'$ )   Wt 110 kg   SpO2 98%   BMI 34.80 kg/m  Physical Exam Vitals and nursing note reviewed.  Constitutional:      Appearance: He is well-developed. He is obese. He is not ill-appearing.  HENT:     Head: Normocephalic and atraumatic.  Eyes:     Pupils: Pupils are equal, round, and reactive to light.  Neck:     Comments: Mid to lower cervical spine tenderness to palpation, no step-off or deformity noted Cardiovascular:     Rate and Rhythm: Normal rate and regular rhythm.     Heart sounds: Normal heart sounds. No murmur heard. Pulmonary:     Effort: Pulmonary effort is normal. No respiratory distress.  Breath sounds: Normal breath sounds. No wheezing.  Abdominal:     Palpations: Abdomen is soft.  Musculoskeletal:        General: No deformity.     Cervical back: Neck supple. Tenderness present.  Lymphadenopathy:     Cervical: No cervical adenopathy.  Skin:    General: Skin is warm and dry.  Neurological:     Mental Status: He is alert and oriented to person, place, and time.     Comments: 5 out of 5 grip, triceps, biceps, deltoid strength bilaterally, sensation grossly intact  Psychiatric:        Mood and Affect: Mood normal.    ED Results / Procedures / Treatments   Labs (all labs ordered are listed, but only abnormal results are displayed) Labs Reviewed - No data to display  EKG None  Radiology CT Cervical Spine Wo  Contrast  Result Date: 04/20/2022 CLINICAL DATA:  Acute neck pain, prior cervical surgery. EXAM: CT CERVICAL SPINE WITHOUT CONTRAST TECHNIQUE: Multidetector CT imaging of the cervical spine was performed without intravenous contrast. Multiplanar CT image reconstructions were also generated. RADIATION DOSE REDUCTION: This exam was performed according to the departmental dose-optimization program which includes automated exposure control, adjustment of the mA and/or kV according to patient size and/or use of iterative reconstruction technique. COMPARISON:  04/11/2021. FINDINGS: Alignment: There is mild anterolisthesis at C3-C4. Skull base and vertebrae: No acute fracture. Anterior spinal fusion hardware is noted from C5-C7 with no evidence of hardware loosening. Soft tissues and spinal canal: No prevertebral fluid or swelling. No visible canal hematoma. Disc levels: C1-C2: Mild degenerative changes at the C1-C2 articulation anteriorly. C2-C3: No significant stenosis. C3-C4: There is a disc herniation with mild uncovertebral osteophyte formation. No significant spinal canal or neural foraminal stenosis. C4-C5: There is a left paracentral disc herniation resulting in mass effect on the ventral aspect of the spinal cord on the left. Mild uncovertebral osteophyte formation. No significant neural foraminal stenosis. C5-C6: Anterior spinal fusion. There is uncovertebral osteophyte formation. The spinal canal is not well evaluated due to hardware artifact. There is facet arthropathy resulting in moderate left neural foraminal stenosis. The right neural foramen is within normal limits. C6-C7: Anterior cervical spinal fusion hardware. No significant spinal canal or neural foraminal stenosis. C7-T1: No significant spinal canal or neural foraminal stenosis. Upper chest: Negative. Other: Carotid artery calcifications. IMPRESSION: 1. Multilevel degenerative changes and disc herniations, most pronounced at C4-C5 and not  significantly changed from the prior exam. 2. Anterior cervical spinal fusion hardware at C5-C7 with no evidence of hardware loosening. Electronically Signed   By: Brett Fairy M.D.   On: 04/20/2022 22:32    Procedures Procedures    Medications Ordered in ED Medications  ketorolac (TORADOL) 30 MG/ML injection 30 mg (has no administration in time range)  oxyCODONE-acetaminophen (PERCOCET/ROXICET) 5-325 MG per tablet 1 tablet (has no administration in time range)    ED Course/ Medical Decision Making/ A&P                           Medical Decision Making Risk Prescription drug management.   This patient presents to the ED for concern of left neck and arm pain, this involves an extensive number of treatment options, and is a complaint that carries with it a high risk of complications and morbidity.  I considered the following differential and admission for this acute, potentially life threatening condition.  The differential diagnosis includes cervical radiculopathy, acute  fracture, sprain, muscle spasm  MDM:    This is a 66 year old male who presents with pain that radiates from the neck into the left arm.  He is nontoxic and vital signs are notable for blood pressure 165/77.  He has appropriately been on steroid Dosepak but with minimal relief.  History and physical exam is most consistent with cervical radiculopathy.  He is neurologically intact.  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  Patient was given a dose of Toradol and Percocet.  I discussed with him that he needs to contact Dr. Lynann Bologna for repeat evaluation and possible further imaging.  Given absence of hard neurologic symptoms, do not feel he needs advanced imaging or MRI today.  We will give a second steroid Dosepak and a short course of pain medication for breakthrough pain.  (Labs, imaging, consults)  Labs: I Ordered, and personally interpreted labs.  The pertinent results include:  None  Imaging Studies ordered: I ordered imaging studies including CT cervical spine I independently visualized and interpreted imaging. I agree with the radiologist interpretation  Additional history obtained from chart review.  External records from outside source obtained and reviewed including operative notes  Cardiac Monitoring: The patient was maintained on a cardiac monitor.  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  egd   BPH associated with nocturia    Duane retraction syndrome    left eye   Full dentures    GERD (gastroesophageal reflux disease)    History of adenomatous polyp of colon    2010 tubular adenoma and hyperplastic polyps/  08-20-2014  tubular adenoma   History of gastric ulcer    remote hx   Hyperlipidemia    Hypertension    Left ureteral stone    Strabismus    left eye   Wears glasses      Medicines Meds ordered this encounter  Medications   ketorolac (TORADOL) 30 MG/ML injection 30 mg   oxyCODONE-acetaminophen (PERCOCET/ROXICET) 5-325 MG per tablet 1 tablet    I have reviewed the patients home medicines and have made adjustments as needed  Problem List / ED Course: Problem List Items Addressed This Visit   None Visit Diagnoses     Cervical radiculopathy    -  Primary                   Final Clinical Impression(s) / ED Diagnoses Final diagnoses:  Cervical radiculopathy    Rx / DC Orders ED Discharge Orders     None         Merryl Hacker, MD 04/21/22 0151

## 2022-07-06 ENCOUNTER — Other Ambulatory Visit: Payer: Self-pay | Admitting: Family Medicine

## 2022-07-06 DIAGNOSIS — Z136 Encounter for screening for cardiovascular disorders: Secondary | ICD-10-CM

## 2022-07-07 ENCOUNTER — Ambulatory Visit
Admission: RE | Admit: 2022-07-07 | Discharge: 2022-07-07 | Disposition: A | Discharge status: Home / Self Care | Payer: 59 | Source: Ambulatory Visit | Attending: Family Medicine | Admitting: Family Medicine

## 2022-07-07 DIAGNOSIS — Z136 Encounter for screening for cardiovascular disorders: Secondary | ICD-10-CM

## 2022-07-27 ENCOUNTER — Encounter: Payer: Self-pay | Admitting: Surgery

## 2022-07-27 ENCOUNTER — Ambulatory Visit: Payer: 59 | Admitting: Surgery

## 2022-07-27 VITALS — BP 151/78 | HR 67 | Temp 98.0°F | Resp 20 | Ht 70.0 in | Wt 230.0 lb

## 2022-07-27 DIAGNOSIS — I6521 Occlusion and stenosis of right carotid artery: Secondary | ICD-10-CM

## 2022-07-27 NOTE — Progress Notes (Signed)
[facility]  Patient name: Jason Guzman MRN: [id number] DOB: 1956/03/05 Sex: male   REQUESTING PROVIDER:    Marda Stalker   REASON FOR CONSULT:    Carotid disease  HISTORY OF PRESENT ILLNESS:   Jason Guzman is a 66 y.o. male, who is referred for evaluation of carotid disease.  He recently underwent a carotid ultrasound for evaluation of neck pain.  This revealed a moderate right-sided stenosis.  He is asymptomatic.  Specifically, he denies numbness or weakness in either extremity.  He denies slurred speech.  He denies amaurosis fugax.  The patient does take a statin for hypercholesterolemia.  He is medically managed for hypertension he is a non-smoker.  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 No pertinent family history.  SOCIAL HISTORY:   Social History   Socioeconomic History   Marital status: Married    Spouse name: Not on file   Number of children: 2   Years of education: GED   Highest education level: Not on file  Occupational History    Employer: UNEMPLOYED  Tobacco Use   Smoking status: Never   Smokeless tobacco: Never  Vaping Use   Vaping Use: Never used  Substance and Sexual Activity   Alcohol use: No   Drug use: No   Sexual activity: Not on file  Other Topics Concern   Not on file  Social History Narrative   Not on file   Social Determinants of Health   Financial Resource Strain: Not on  file  Food Insecurity: Not on file  Transportation Needs: Not on file  Physical Activity: Not on file  Stress: Not on file  Social Connections: Not on file  Intimate Partner Violence: Not on file    ALLERGIES:    No Known Allergies  CURRENT MEDICATIONS:    Current Outpatient Medications  Medication Sig Dispense Refill   atorvastatin (LIPITOR) 20 MG tablet Take 20 mg by mouth every evening.   2   lisinopril (ZESTRIL) 20 MG tablet Take 20 mg by mouth daily.     methocarbamol (ROBAXIN) 500 MG tablet Take 500-1,000 mg by mouth every 6 (six) hours as needed.     omega-3 acid ethyl esters (LOVAZA) 1 g capsule Take 1 capsule by mouth 2 (two) times daily.     omeprazole (PRILOSEC) 40 MG capsule Take 40 mg by mouth every morning.     sildenafil (REVATIO) 20 MG tablet SMARTSIG:2-3 Tablet(s) By Mouth Daily PRN     tamsulosin (FLOMAX) 0.4 MG CAPS capsule Take 0.4 mg by mouth daily.     No current facility-administered medications for this visit.    REVIEW OF SYSTEMS:   '[X]'$  denotes positive finding, '[ ]'$  denotes negative finding Cardiac  Comments:  Chest pain or chest pressure:    Shortness of breath upon exertion:  Short of breath when lying flat:    Irregular heart rhythm:        Vascular    Pain in calf, thigh, or hip brought on by ambulation:    Pain in feet at night that wakes you up from your sleep:     Blood clot in your veins:    Leg swelling:         Pulmonary    Oxygen at home:    Productive cough:     Wheezing:         Neurologic    Sudden weakness in arms or legs:     Sudden numbness in arms or legs:     Sudden onset of difficulty speaking or slurred speech:    Temporary loss of vision in one eye:     Problems with dizziness:         Gastrointestinal    Blood in stool:      Vomited blood:         Genitourinary    Burning when urinating:     Blood in urine:        Psychiatric    Major depression:         Hematologic    Bleeding problems:     Problems with blood clotting too easily:        Skin    Rashes or ulcers:        Constitutional    Fever or chills:     PHYSICAL EXAM:   Vitals:   07/27/22 1009 07/27/22 1011  BP: (!) 155/93 (!) 151/78  Pulse: 67   Resp: 20   Temp: 98 F (36.7 C)   SpO2: 95%   Weight: 230 lb (104.3 kg)   Height: '5\' 10"'$  (1.778 m)     GENERAL: The patient is a well-nourished male, in no acute distress. The vital signs are documented above. CARDIAC: There is a regular rate and rhythm.  VASCULAR: Palpable bilateral radial and bilateral pedal pulses PULMONARY: Nonlabored respirations ABDOMEN: Soft and non-tender with no palpable mass MUSCULOSKELETAL: There are no major deformities or cyanosis. NEUROLOGIC: No focal weakness or paresthesias are detected. SKIN: There are no ulcers or rashes noted. PSYCHIATRIC: The patient has a normal affect.  STUDIES:   I have reviewed the following carotid ultrasound: 1. Moderate (50-69%) stenosis proximal right internal carotid artery secondary to bulky calcified atherosclerotic plaque. 2. Mild (1-49%) stenosis proximal left internal carotid artery secondary to mild heterogeneous atherosclerotic plaque. 3. Vertebral arteries are patent with normal antegrade flow.  ASSESSMENT and PLAN   Carotid: I discussed that we would make recommendations for surgery if he has a asymptomatic stenosis greater than 80% or asymptomatic stenosis greater than 50%.  Since he does not meet these criteria, we need to focus on medical management which consists of taking a statin, good blood pressure control, exercise, dietary improvements, and starting a 81 mg aspirin.  I will have him follow-up for a repeat ultrasound in 1 year.   Leia Alf, MD, FACS [facility] [phone] Pager [phone]

## 2022-12-15 ENCOUNTER — Other Ambulatory Visit: Payer: Self-pay

## 2022-12-15 ENCOUNTER — Encounter (HOSPITAL_COMMUNITY): Payer: Self-pay

## 2022-12-15 ENCOUNTER — Emergency Department (HOSPITAL_COMMUNITY)
Admission: EM | Admit: 2022-12-15 | Discharge: 2022-12-15 | Disposition: A | Discharge status: Home / Self Care | Payer: 59 | Attending: Emergency Medicine | Admitting: Emergency Medicine

## 2022-12-15 DIAGNOSIS — Z79899 Other long term (current) drug therapy: Secondary | ICD-10-CM | POA: Insufficient documentation

## 2022-12-15 DIAGNOSIS — I10 Essential (primary) hypertension: Secondary | ICD-10-CM | POA: Diagnosis not present

## 2022-12-15 DIAGNOSIS — I160 Hypertensive urgency: Secondary | ICD-10-CM | POA: Diagnosis present

## 2022-12-15 LAB — BASIC METABOLIC PANEL
Anion gap: 8 (ref 5–15)
BUN: 9 mg/dL (ref 8–23)
CO2: 29 mmol/L (ref 22–32)
Calcium: 9 mg/dL (ref 8.9–10.3)
Chloride: 102 mmol/L (ref 98–111)
Creatinine, Ser: 0.97 mg/dL (ref 0.61–1.24)
GFR, Estimated: >60 mL/min (ref >60)
Glucose, Bld: 99 mg/dL (ref 70–99)
Potassium: 4.6 mmol/L (ref 3.5–5.1)
Sodium: 139 mmol/L (ref 135–145)

## 2022-12-15 LAB — CBC
HCT: 48.2 % (ref 39.0–52.0)
Hemoglobin: 16.4 g/dL (ref 13.0–17.0)
MCH: 29.2 pg (ref 26.0–34.0)
MCHC: 34 g/dL (ref 30.0–36.0)
MCV: 85.9 fL (ref 80.0–100.0)
Platelets: 142 10*3/uL — ABNORMAL LOW (ref 150–400)
RBC: 5.61 MIL/uL (ref 4.22–5.81)
RDW: 12.2 % (ref 11.5–15.5)
WBC: 5.9 10*3/uL (ref 4.0–10.5)
nRBC: 0 % (ref 0.0–0.2)

## 2022-12-15 MED ORDER — AMLODIPINE BESYLATE 5 MG PO TABS: 5.0000 mg | ORAL_TABLET | Freq: Every day | ORAL | 0 refills | Status: DC

## 2022-12-15 MED ORDER — AMLODIPINE BESYLATE 5 MG PO TABS
5.0000 mg | ORAL_TABLET | Freq: Once | ORAL | Status: AC
  Administered 2022-12-15: 5 mg via ORAL
  Filled 2022-12-15: qty 1

## 2022-12-15 NOTE — ED Notes (Signed)
Provider made aware of patient BP, ordered PO amlodipine for patient to take prior to discharge. Patient agreeable with this plan.

## 2022-12-15 NOTE — Discharge Instructions (Signed)
As discussed, and lieu of your prescribed combination tablet, you are starting a new medication called amlodipine.  Continue taking your prior lisinopril dose, 40 mg in addition to this new medication daily.  Call your physician today to schedule follow-up next week to discuss this.  Return here for concerning changes in your condition.

## 2022-12-15 NOTE — ED Triage Notes (Signed)
Pt arrived POV from home c/o HTN. Pt states his PCP just changed his medication. Pt states he was taking lisinopril but his PCP changed him to another medication but pt has not been able to get that filled. Pt denies any pain.

## 2022-12-15 NOTE — ED Provider Triage Note (Addendum)
Emergency Medicine Provider Triage Evaluation Note  Jason Guzman , a 67 y.o. male  was evaluated in triage.  Pt complains of elevated blood pressure.  He denies any symptoms at all currently.  He states he does occasionally have headaches and has been having headaches for the past month but states that they are not severe.  No blurry vision double vision no confusion slurred speech.  Has a history of hypertension and was recently changed from lisinopril to another medication which he cannot recall the name of.  States that this morning his blood pressure was higher than usual which prompted her to come to the emergency room.  No chest pain or difficulty breathing.  Did take this BP medicine this AM.  On further questioning it appears pt actually has not started this new BP medicine -- is still taking lisinopril but twice the original dose.   Review of Systems  Positive: Elevated blood pressure Negative: Fever  Physical Exam  BP (!) 199/90 (BP Location: Right Arm)   Pulse 87   Temp 98 F (36.7 C) (Oral)   Resp 18   SpO2 97%  Gen:   Awake, no distress  Resp:  Normal effort  MSK:   Moves extremities without difficulty Other:    Medical Decision Making  Medically screening exam initiated at 6:24 AM.  Appropriate orders placed.  Jason Guzman was informed that the remainder of the evaluation will be completed by another provider, this initial triage assessment does not replace that evaluation, and the importance of remaining in the ED until their evaluation is complete.  Labs   Jason Guzman Jason Guzman, [place] 12/15/22 0625    Jason Sias, PA 12/15/22 0626    Jason Sias, PA 12/15/22 [phone]

## 2022-12-15 NOTE — ED Provider Notes (Signed)
[facility] EMERGENCY DEPARTMENT Provider Note   CSN: [id number] Arrival date & time: 12/15/22  5885     History  Chief Complaint  Patient presents with   Hypertension    Jason Guzman is a 67 y.o. male.  HPI Patient presents with concern of hypertension.  Patient has no complaints physically, no weakness, has been in his usual state of health.  However, he notes that he has recently had increasing blood pressure readings.  Last week he saw his physician, and had a change in his medications from his typical lisinopril to dual agent olmesartan amlodipine.  He notes that this medication was not paid for by his insurance company and has been unable to obtain it.  He has been taking his current recommendation of lisinopril, 40 mg daily.  He took that today, soon thereafter noticed his blood pressure was elevated, but by the time of ED arrival blood pressure was essentially normal.  Again patient has no physical complaints.    Home Medications Prior to Admission medications   Medication Sig Start Date End Date Taking? Authorizing Provider  amLODipine (NORVASC) 5 MG tablet Take 1 tablet (5 mg total) by mouth daily. 12/15/22  Yes Carmin Muskrat, MD  atorvastatin (LIPITOR) 20 MG tablet Take 20 mg by mouth every evening.  06/09/17   [provider]  lisinopril (ZESTRIL) 20 MG tablet Take 20 mg by mouth daily. 07/06/22   [provider]  methocarbamol (ROBAXIN) 500 MG tablet Take 500-1,000 mg by mouth every 6 (six) hours as needed. 05/25/22   [provider]  omega-3 acid ethyl esters (LOVAZA) 1 g capsule Take 1 capsule by mouth 2 (two) times daily. 07/06/22   [provider]  omeprazole (PRILOSEC) 40 MG capsule Take 40 mg by mouth every morning. 07/19/22   [provider]  sildenafil (REVATIO) 20 MG tablet SMARTSIG:2-3 Tablet(s) By Mouth Daily PRN 07/19/22   [provider]  tamsulosin (FLOMAX) 0.4 MG CAPS capsule Take 0.4 mg by  mouth daily. 07/06/22   [provider]      Allergies    Patient has no known allergies.    Review of Systems   Review of Systems  All other systems reviewed and are negative.   Physical Exam Updated Vital Signs BP 134/77 (BP Location: Right Arm)   Pulse 76   Temp 98 F (36.7 C) (Oral)   Resp 20   SpO2 97%  Physical Exam Vitals and nursing note reviewed.  Constitutional:      General: He is not in acute distress.    Appearance: He is well-developed.  HENT:     Head: Normocephalic and atraumatic.  Eyes:     Conjunctiva/sclera: Conjunctivae normal.  Cardiovascular:     Rate and Rhythm: Normal rate and regular rhythm.  Pulmonary:     Effort: Pulmonary effort is normal. No respiratory distress.     Breath sounds: No stridor.  Abdominal:     General: There is no distension.  Skin:    General: Skin is warm and dry.  Neurological:     General: No focal deficit present.     Mental Status: He is alert and oriented to person, place, and time.     Cranial Nerves: No cranial nerve deficit.     Motor: No weakness.     Coordination: Coordination normal.  Psychiatric:        Mood and Affect: Mood normal.     ED Results / Procedures /  Treatments   Labs (all labs ordered are listed, but only abnormal results are displayed) Labs Reviewed  CBC - Abnormal; Notable for the following components:      Result Value   Platelets 142 (*)    All other components within normal limits  BASIC METABOLIC PANEL    EKG EKG Interpretation  Date/Time:  Tuesday December 15 2022 06:38:19 EST Ventricular Rate:  84 PR Interval:  168 QRS Duration: 98 QT Interval:  348 QTC Calculation: 411 R Axis:   138 Text Interpretation: Normal sinus rhythm Anterolateral infarct , age undetermined similar to prior (2018) Abnormal ECG Confirmed by Carmin Muskrat [phone]) on 12/15/2022 11:51:07 AM  Radiology No results found.  Procedures Procedures    Medications Ordered in ED Medications -  No data to display  ED Course/ Medical Decision Making/ A&P                             Medical Decision Making Patient with a history of hypertension presents with concern for hypertensive urgency.  No physical complaints, no neuro complaints nor findings are generally reassuring.  Blood pressure here improved compared to his reported value at home.  Some suspicion for tolerance to his prior medication regimen contributing to his abnormal readings.  Labs reviewed, discussed, EKG reviewed, discussed, no evidence for endorgan damage, no distress, and he has no complaints throughout his ED course.  After discussing his recently prescribed medication, an alternative was substituted to which he was amenable.  He will follow-up with his physician, start his new medication regimen.  Amount and/or Complexity of Data Reviewed External Data Reviewed: notes. Labs:  Decision-making details documented in ED Course. ECG/medicine tests:  Decision-making details documented in ED Course.  Risk Prescription drug management.           Final Clinical Impression(s) / ED Diagnoses Final diagnoses:  Hypertensive urgency    Rx / DC Orders ED Discharge Orders          Ordered    amLODipine (NORVASC) 5 MG tablet  Daily        12/15/22 1145              Carmin Muskrat, MD 12/15/22 1152

## 2023-06-21 ENCOUNTER — Encounter (INDEPENDENT_AMBULATORY_CARE_PROVIDER_SITE_OTHER): Payer: Self-pay | Admitting: Otolaryngology

## 2023-06-21 ENCOUNTER — Ambulatory Visit (INDEPENDENT_AMBULATORY_CARE_PROVIDER_SITE_OTHER): Payer: 59 | Admitting: Otolaryngology

## 2023-06-21 VITALS — BP 131/78 | HR 80 | Ht 69.0 in | Wt 225.0 lb

## 2023-06-21 DIAGNOSIS — H8110 Benign paroxysmal vertigo, unspecified ear: Secondary | ICD-10-CM

## 2023-06-21 DIAGNOSIS — M542 Cervicalgia: Secondary | ICD-10-CM | POA: Diagnosis not present

## 2023-06-21 DIAGNOSIS — H938X1 Other specified disorders of right ear: Secondary | ICD-10-CM

## 2023-06-21 DIAGNOSIS — H9311 Tinnitus, right ear: Secondary | ICD-10-CM

## 2023-06-21 DIAGNOSIS — R42 Dizziness and giddiness: Secondary | ICD-10-CM

## 2023-06-21 DIAGNOSIS — H9190 Unspecified hearing loss, unspecified ear: Secondary | ICD-10-CM

## 2023-06-21 NOTE — Progress Notes (Unsigned)
ENT CONSULT:  Reason for Consult: vertigo and right sided tinnitus   HPI: Jason Guzman is an 67 y.o. male who is here for 1 mo of dizziness/vertigo. He wakes up in the morning and once he is out of bed, he starts to have vertigo, lasting 2 min or so. He denies N/V.  He reports ringing/tinnitus all the time on the right side only and at times right ear feels stuffed up. He reports that he has carotid artery stenosis right side and he has a vascular specialist he is going to see for it in a few weeks (was previously seen and told no surgery is needed). No ear pain. Able to walk. Hx of ACDF June 2023, left sided approach C3-7 and he feels it helped with his radiculopathy pain. He then had another left sided ACDF C3-4 after having an accident and also required rotator cuff surgery right side, which was surgically treated as well.  Record review indicates that he was sent to vestibular rehab by his PCP.  He has not started that yet.   Records Reviewed:  Note by Jarrett Soho, PA 06/14/23 - seen for intermittent vertigo  WHARTON, COURTNEY 06/14/2023 02:11:50 PM > I have placed a ref to ENT for tinnitus/vertigo but figured they may take a while to see him, thought a PT referral may be worth a trial while he is awaiting that evaluation, Womack, LaDonna 06/15/2023 11:29:35 AM >noted, faxed via ecw to [facility]   Epic notes - seen in ED 12/15/2022 for hypertensive urgency Hx of carotid stenosis f/b Dr Coral [facility] Hx of cervical radiculopathy and left shoulder and neck pain  04/20/2022 This is a 67 year old male who presents with left shoulder neck pain. Patient reports he has had 2 to 3-week history of worsening pain in the left shoulder and neck. He states when he turns his head to the right he has radiation of pain from his neck down into his arm. He has not had any weakness, numbness, tingling of that arm. He has been on a course of prednisone since Wednesday but states  that that has not seemed to help. He has a history of cervical fusion by Dr. 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  egd   BPH associated with nocturia    Duane retraction syndrome    left eye   Full dentures    GERD (gastroesophageal reflux disease)    History of adenomatous polyp of colon    2010 tubular adenoma and hyperplastic polyps/  08-20-2014  tubular adenoma   History of gastric ulcer    remote hx   Hyperlipidemia    Hypertension    Left ureteral stone    Strabismus    left eye   Wears glasses     Past Surgical History:  Procedure Laterality Date   CLOSED REDUCTION AND EXTERNAL FIXATOR FOR COMPLEX FRACTURE  11/26/2005  dr Renae Fickle at [facility]   complex radial head fx dislocation ligamentous injury (elbow)   COLONOSCOPY  last one 08-20-2014   CYSTOSCOPY/URETEROSCOPY/HOLMIUM LASER/STENT PLACEMENT Left 07/28/2017   Procedure: CYSTOSCOPY/URETEROSCOPY/RETROGRADE/HOLMIUM LASER/STENT PLACEMENT;  Surgeon: Sebastian Ache, MD;  Location: [facility];  Service: Urology;  Laterality: Left;   ESOPHAGOGASTRODUODENOSCOPY  09/07/2011   REMOVAL EXTERNAL FIXATOR/ RADICAL HEAD ARTHROPLASTY/ LIGAMENT REPAIR/ TREATMENT DISLOCATON Left 12/08/2005   dr handy at [facility]    History reviewed. No pertinent family history.  Social History:  reports that he has never smoked. He has never used smokeless tobacco. He reports that he does not drink alcohol and does not use drugs.  Allergies: No Known Allergies  Medications: I have reviewed the patient's current medications.  No results found for this or any previous visit (from the past 48 hour(s)).  No results found.  The PMH, PSH, Medications, Allergies, and SH were reviewed and updated.  ROS: Constitutional: Negative for fever, weight loss and weight gain. Cardiovascular: Negative for chest pain and dyspnea on exertion. Respiratory: Is not experiencing shortness of breath at  rest. Gastrointestinal: Negative for nausea and vomiting. Neurological: Negative for headaches. Psychiatric: The patient is not nervous/anxious  Blood pressure 131/78, pulse 80, height 5\' 9"  (1.753 m), weight 225 lb (102.1 kg), SpO2 95%.  PHYSICAL EXAM:  Exam: General: Well-developed, well-nourished Respiratory Respiratory effort: Equal inspiration and expiration without stridor Cardiovascular Peripheral Vascular: Warm extremities with equal color/perfusion Eyes: No nystagmus with equal extraocular motion bilaterally Neuro/Psych/Balance: Patient oriented to person, place, and time; Appropriate mood and affect; Gait is intact with no imbalance; Cranial nerves I-XII are intact.  No evidence of nystagmus on exam.  Head and Face Inspection: Normocephalic and atraumatic without mass or lesion Facial Strength: Facial motility symmetric and full bilaterally ENT Pinna: External ear intact and fully developed External canal: Canal is patent with intact skin Tympanic Membrane: Clear and mobile External Nose: No scar or anatomic deformity Internal Nose: Septum intact and midline. No edema, polyp, or rhinorrhea Lips, Teeth, and gums: Mucosa and teeth intact and viable TMJ: No pain to palpation with full mobility Oral cavity/oropharynx: No erythema or exudate, no lesions present Neck Neck and Trachea: Midline trachea without mass or lesion Thyroid: No mass or nodularity Lymphatics: No lymphadenopathy  Procedure: None   Assessment/Plan: Encounter Diagnoses  Name Primary?   Cervicalgia    Benign paroxysmal positional vertigo, unspecified laterality Yes   Tinnitus of right ear    Sensation of fullness in right ear    Hearing loss, unspecified hearing loss type, unspecified laterality    Vertigo    67 year old male with history of 2 ACDF procedures and chronic neck pain as well as radiculopathy that improved following neck surgery, history of carotid stenosis followed by vascular  currently no indication for surgical interventions, who is here for initial evaluation of intermittent vertigo for 1 month, typically triggered by changes in body position mostly in the morning when he gets up, symptoms last for seconds to minutes at a time and then resolve.  No associated headache or nausea or vomiting.  He also endorses right-sided tinnitus, but no ear pain or drainage.  No prior ear surgeries.  He does have sensation of ear fullness from time to time.  No recent hearing evaluation.  Ear exam today is unremarkable and he has normal gait, no nystagmus or any other concerning findings on exam as well.  Ddx includes BPPV which is most consistent with his presentation versus cervicalgia and balance problems from chronic neck pain.  Will refer to audiology for vestibular testing including Dix-Hallpike and vestibular therapy which should include Epley maneuver.  Will also order audiogram to evaluate right-sided tinnitus.  He will return after testing and vestibular rehab.  Ear fullness could be due to chronic eustachian tube dysfunction in the setting of nasal congestion, will initiate treatment for that based on results of hearing evaluation.  - Audio - will call with results - Vestibular rehab (for BPPV and for chronic cervicalgia/balance  problems) - RTC 2 mo - sooner if needed based on Audio results   Thank you for allowing me to participate in the care of this patient. Please do not hesitate to contact me with any questions or concerns.   Ashok Croon, MD Otolaryngology [facility] Phone: [phone] Fax: [phone]    06/21/2023, 1:08 PM

## 2023-06-28 ENCOUNTER — Ambulatory Visit: Payer: 59 | Attending: Family Medicine | Admitting: Physical Therapy

## 2023-06-28 VITALS — BP 137/68 | HR 67

## 2023-06-28 DIAGNOSIS — R42 Dizziness and giddiness: Secondary | ICD-10-CM | POA: Insufficient documentation

## 2023-06-28 NOTE — Therapy (Signed)
OUTPATIENT PHYSICAL THERAPY VESTIBULAR EVALUATION     Patient Name: Jason Guzman MRN: [id number] DOB:September 06, 1956, 67 y.o., male Today's Date: 06/28/2023  END OF SESSION:  PT End of Session - 06/28/23 1107     Visit Number 1    Number of Visits 5    Date for PT Re-Evaluation 08/27/23    Authorization Type [employer]    PT Start Time 1105    PT Stop Time 1145    PT Time Calculation (min) 40 min    Activity Tolerance Patient tolerated treatment well    Behavior During Therapy [facility] for tasks assessed/performed             Past Medical History:  Diagnosis Date   Barrett esophagus 09-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  egd   BPH associated with nocturia    Duane retraction syndrome    left eye   Full dentures    GERD (gastroesophageal reflux disease)    History of adenomatous polyp of colon    2010 tubular adenoma and hyperplastic polyps/  08-20-2014  tubular adenoma   History of gastric ulcer    remote hx   Hyperlipidemia    Hypertension    Left ureteral stone    Strabismus    left eye   Wears glasses    Past Surgical History:  Procedure Laterality Date   CLOSED REDUCTION AND EXTERNAL FIXATOR FOR COMPLEX FRACTURE  11/26/2005  dr Renae Fickle at [facility]   complex radial head fx dislocation ligamentous injury (elbow)   COLONOSCOPY  last one 08-20-2014   CYSTOSCOPY/URETEROSCOPY/HOLMIUM LASER/STENT PLACEMENT Left 07/28/2017   Procedure: CYSTOSCOPY/URETEROSCOPY/RETROGRADE/HOLMIUM LASER/STENT PLACEMENT;  Surgeon: Sebastian Ache, MD;  Location: [facility];  Service: Urology;  Laterality: Left;   ESOPHAGOGASTRODUODENOSCOPY  09/07/2011   REMOVAL EXTERNAL FIXATOR/ RADICAL HEAD ARTHROPLASTY/ LIGAMENT REPAIR/ TREATMENT DISLOCATON Left 12/08/2005   dr handy at [facility]   Patient Active Problem List   Diagnosis Date Noted   Strabismus 08/07/2013   Ptosis 08/04/2013    PCP:  Jarrett Soho, PA-C REFERRING PROVIDER:  Jarrett Soho, PA-C  REFERRING DIAG: R42 (ICD-10-CM) - Dizziness and giddiness    THERAPY DIAG:  Dizziness and giddiness  ONSET DATE: 06/15/2023   Rationale for Evaluation and Treatment: Rehabilitation  SUBJECTIVE:   SUBJECTIVE STATEMENT: Reports dizziness started about a month ago. When he lays in the bed and turns over, then things will start spinning. When getting up to walk around, will feel like he is losing his balance. Reports it feels worse when he is turning to his L. Has ringing in his R ear. Will be seeing the audiologist about this next Monday. When he lays down at night, feels like he can feel his heart beating in his R ear. Reports that has been going on for 6 months. No falls. Reports this has also happened in the past, but it has been a while. Pt reports having BP medication changed and dizziness feeling better. Didn't really have any spinning this morning. Balance is feeling a lot better. Main symptoms are dizziness when coming upright.   Pt accompanied by: self  PERTINENT HISTORY: PMH: GERD, HLD, HTN, L eye stabisumus,hx of ACDF, R carotid artery stenosis   Per ENT: Jason Guzman is an 67 y.o. male who is here for 1 mo of dizziness/vertigo. He wakes up in the morning and once he is out of bed, he starts to have vertigo, lasting 2 min or so. He denies N/V.  He reports ringing/tinnitus all the time on the right side only  and at times right ear feels stuffed up. He reports that he has carotid artery stenosis right side and he has a vascular specialist he is going to see for it in a few weeks (was previously seen and told no surgery is needed). No ear pain. Able to walk. Hx of ACDF June 2023, left sided approach C3-7 and he feels it helped with his radiculopathy pain. He then had another left sided ACDF C3-4 after having an accident and also required rotator cuff surgery right side, which was surgically treated as well.     PAIN:  Are you having pain? No  Vitals:   06/28/23 1117  BP: 137/68  Pulse: 67     PRECAUTIONS: None   WEIGHT BEARING  RESTRICTIONS: No  FALLS: Has patient fallen in last 6 months? No   PLOF: Independent and Vocation/Vocational requirements: Therapist, art for [employer] [place]   PATIENT GOALS: wants to get his dizziness resolved, find out what is causing his dizziness   OBJECTIVE:   DIAGNOSTIC FINDINGS:  US Carotid 07/07/22: IMPRESSION: 1. Moderate (50-69%) stenosis proximal right internal carotid artery secondary to bulky calcified atherosclerotic plaque. 2. Mild (1-49%) stenosis proximal left internal carotid artery secondary to mild heterogeneous atherosclerotic plaque. 3. Vertebral arteries are patent with normal antegrade flow.  COGNITION: Overall cognitive status: Within functional limits for tasks assessed     Cervical ROM:    WNL  Modified VBI testing: negative bilaterally   GAIT: Gait pattern: WFL Distance walked: clinic distances  Assistive device utilized: None Level of assistance: Modified independence   PATIENT SURVEYS:  Hospital doctor did not capture   VESTIBULAR ASSESSMENT:  GENERAL OBSERVATION: Ambulates in with no AD.    SYMPTOM BEHAVIOR:  Subjective history: See above.   Non-Vestibular symptoms: headaches, tinnitus, and nausea/vomiting  Type of dizziness: Imbalance (Disequilibrium) and Spinning/Vertigo  Frequency: every morning when getting out of bed or rolling   Duration: 1-2 minutes   Aggravating factors: Induced by position change: rolling to the right, rolling to the left, and supine to sit  Relieving factors:  time  Progression of symptoms: better  OCULOMOTOR EXAM:  Ocular Alignment: normal, pt with strabismus in L eye  Ocular ROM:  limited due to L eye strabismus, WFL with R eye  Spontaneous Nystagmus: absent  Gaze-Induced Nystagmus: absent  Smooth Pursuits: intact and difficulty performing with L eye due to strabismus, cues to try to just move eyes   Saccades: extra eye movements and 2-3 saccadic beats in horizontal direction with R eye, difficulty  to perform with L eye due to strabismus, pt tends to try to move his head    VESTIBULAR - OCULAR REFLEX:   Slow VOR: Positive Bilaterally and Comment: pt unable to keep eyes focused on therapist's nose initially, when cued and tried again, pt more difficulty keeping focus going to the R, but then able to perform normally, but slowly    VOR Cancellation: Normal  Head-Impulse Test: HIT Right: negative HIT Left: negative No dizziness, difficult to accurately assess  POSITIONAL TESTING: Right Dix-Hallpike: no nystagmus Left Dix-Hallpike: no nystagmus Right Roll Test: no nystagmus Left Roll Test: no nystagmus Right Sidelying: no nystagmus Left Sidelying: no nystagmus  Pt only having symptoms when coming upright   MOTION SENSITIVITY:  Motion Sensitivity Quotient Intensity: 0 = none, 1 = Lightheaded, 2 = Mild, 3 = Moderate, 4 = Severe, 5 = Vomiting  Intensity  1. Sitting to supine 0  2. Supine to L side 0  3.  Supine to R side 0  4. Supine to sitting 1  5. L Hallpike-Dix 0  6. Up from L  1  7. R Hallpike-Dix 0  8. Up from R  1  9. Sitting, head tipped to L knee   10. Head up from L knee   11. Sitting, head tipped to R knee   12. Head up from R knee   13. Sitting head turns x5   14.Sitting head nods x5   15. In stance, 180 turn to L    16. In stance, 180 turn to R       VESTIBULAR TREATMENT:                                                                                                   DATE: 06/28/23   Habituation:  Brandt-Daroff: comment: performed 4 reps each side, pt initially more symptomatic coming up to the R, but improved with incr reps    PATIENT EDUCATION: Education details: Clinical findings, POC, BPPV education, pt negative for BPPV, can trial Austin Miles for home as pt had relief with these today Person educated: Patient Education method: Explanation, Demonstration, Verbal cues, and Handouts Education comprehension: verbalized understanding and returned  demonstration  HOME EXERCISE PROGRAM: Access Code: [facility] URL: [email or web address] Date: 06/28/2023 Prepared by: Sherlie Ban  Exercises - Brandt-Daroff Vestibular Exercise  - 1-2 x daily - 7 x weekly - 1 sets - 4-5 reps  GOALS: Goals reviewed with patient? Yes  SHORT TERM GOALS: ALL STGS =LTGS  LONG TERM GOALS: Target date: 07/26/2023   Pt will be independent with final HEP for dizziness in order to build upon functional gains made in therapy. Baseline:  Goal status: INITIAL  2.  Pt will report 0/5 on MSQ in order to demo improved motion sensitivity.  Baseline:  Goal status: INITIAL  3.  Further balance testing to be assessed as needed.  Baseline:  Goal status: INITIAL    ASSESSMENT:  CLINICAL IMPRESSION: Patient is a 67 year old male referred to Neuro OPPT for dizziness.   Pt's PMH is significant for:  GERD, HLD, HTN, L eye stabisumus,hx of ACDF, R carotid artery stenosis. Pt reporting hx of spinning in the past month when getting out of bed, but reports that has gotten better.  The following deficits were present during the exam: dizziness with return to upright from sidelying and DixHallpike position. Pt negative for positional testing. Difficulty to accurately assess oculomotor and VOR testing due to strabismus on L eye. Pt with difficulty keeping eyes on therapist's nose and with smooth pursuits/saccades, tended to move head vs. Eyes. Performed Francee Piccolo Daroff x4 reps each side with pt reporting improvement in symptoms with return to upright. Provided to pt as HEP.   Pt would benefit from skilled PT to address these impairments and functional limitations and decr dizziness.    OBJECTIVE IMPAIRMENTS: decreased balance and dizziness.   ACTIVITY LIMITATIONS: bed mobility and locomotion level  PARTICIPATION LIMITATIONS:  N/A  PERSONAL FACTORS: Age, Behavior pattern, Past/current experiences, Time since onset of injury/illness/exacerbation, and 3+  comorbidities:  GERD, HLD, HTN, L eye stabisumus,hx of ACDF, R carotid artery stenosis   are also affecting patient's functional outcome.   REHAB POTENTIAL: Good  CLINICAL DECISION MAKING: Stable/uncomplicated  EVALUATION COMPLEXITY: Low   PLAN:  PT FREQUENCY: 1x/week  PT DURATION: 4 weeks  PLANNED INTERVENTIONS: Therapeutic exercises, Therapeutic activity, Neuromuscular re-education, Balance training, Gait training, Patient/Family education, Self Care, Joint mobilization, Vestibular training, and Canalith repositioning  PLAN FOR NEXT SESSION: how is dizziness and brandt daroff? Further assess MSQ or balance testing as needed    Drake Leach, PT, DPT 06/28/2023, 12:20 PM

## 2023-07-05 ENCOUNTER — Ambulatory Visit: Payer: 59 | Attending: Family Medicine | Admitting: Audiologist

## 2023-07-05 DIAGNOSIS — H903 Sensorineural hearing loss, bilateral: Secondary | ICD-10-CM | POA: Insufficient documentation

## 2023-07-05 DIAGNOSIS — Z570 Occupational exposure to noise: Secondary | ICD-10-CM | POA: Diagnosis present

## 2023-07-05 NOTE — Procedures (Signed)
  Outpatient Audiology and [facility] [address] [place], [place]  [address] [phone]  AUDIOLOGICAL  EVALUATION  NAME: Jason Guzman     DOB:   1956-10-13      MRN: [id number]                                                                                     DATE: 07/05/2023     REFERENT: Jarrett Soho, PA-C STATUS: Outpatient DIAGNOSIS: Sensorineural Hearing Loss, Tinnitus     History: [name] was seen for an audiological evaluation. [name] saw Dr. Irene Pap for unilateral tinnitus. [name] is receiving a hearing evaluation due to concerns for tinnitus in his right ear. It used to just be a ring. Now when he lays down he hears his heart beat in the right ear. [name] has difficulty hearing due to occupational noise exposure. He receives [employer] hearing tests annually. No pain or pressure reported in either ear. Tinnitus in the right ear only. No other relevant case history reported.   Evaluation:  Otoscopy showed a clear view of the tympanic membranes, bilaterally Tympanometry results were consistent with normal middle ear function, bilaterally   Audiometric testing was completed using conventional audiometry with supraural transducer. Speech Recognition Thresholds were 25dB in the right ear and 20dB in the left ear. Word Recognition was performed  40dB SL, scored 100% in the right ear and 96% in the left ear. 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   Results:  The test results were reviewed with [name]. 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 No indication from audiology testing of a medical pathology. Tinnitus management discussed. [name] will likely need hearing aids soon, he reported understanding.   Recommendations: 1.   Follow up with Otolaryngology as scheduled. No need for further audiology  testing at this time.   32 minutes spent testing and counseling on results.   Ammie Ferrier  Audiologist, Au.D., CCC-A 07/05/2023  9:44 AM  Cc: Jarrett Soho, PA-C

## 2023-07-19 ENCOUNTER — Ambulatory Visit: Payer: 59 | Admitting: Physical Therapy

## 2023-07-26 ENCOUNTER — Encounter: Payer: Self-pay | Admitting: Physical Therapy

## 2023-08-09 ENCOUNTER — Encounter: Payer: Self-pay | Admitting: Family Medicine

## 2023-08-09 ENCOUNTER — Other Ambulatory Visit: Payer: Self-pay | Admitting: Family Medicine

## 2023-08-09 DIAGNOSIS — R519 Headache, unspecified: Secondary | ICD-10-CM

## 2023-08-16 ENCOUNTER — Other Ambulatory Visit: Payer: Self-pay | Admitting: *Deleted

## 2023-08-16 DIAGNOSIS — I6521 Occlusion and stenosis of right carotid artery: Secondary | ICD-10-CM

## 2023-08-23 ENCOUNTER — Encounter (INDEPENDENT_AMBULATORY_CARE_PROVIDER_SITE_OTHER): Payer: Self-pay | Admitting: Otolaryngology

## 2023-08-23 ENCOUNTER — Ambulatory Visit (INDEPENDENT_AMBULATORY_CARE_PROVIDER_SITE_OTHER): Payer: 59 | Admitting: Otolaryngology

## 2023-08-23 ENCOUNTER — Ambulatory Visit (HOSPITAL_COMMUNITY)
Admission: RE | Admit: 2023-08-23 | Discharge: 2023-08-23 | Disposition: A | Discharge status: Home / Self Care | Payer: 59 | Source: Ambulatory Visit | Attending: Surgery | Admitting: Surgery

## 2023-08-23 ENCOUNTER — Encounter: Payer: Self-pay | Admitting: Surgery

## 2023-08-23 ENCOUNTER — Ambulatory Visit: Payer: 59 | Admitting: Surgery

## 2023-08-23 VITALS — BP 135/77 | HR 66 | Temp 97.8°F | Resp 18 | Ht 69.0 in | Wt 233.7 lb

## 2023-08-23 VITALS — BP 148/69 | HR 75 | Ht 69.0 in | Wt 234.0 lb

## 2023-08-23 DIAGNOSIS — H903 Sensorineural hearing loss, bilateral: Secondary | ICD-10-CM

## 2023-08-23 DIAGNOSIS — I6521 Occlusion and stenosis of right carotid artery: Secondary | ICD-10-CM

## 2023-08-23 DIAGNOSIS — H93A1 Pulsatile tinnitus, right ear: Secondary | ICD-10-CM

## 2023-08-23 DIAGNOSIS — H8110 Benign paroxysmal vertigo, unspecified ear: Secondary | ICD-10-CM

## 2023-08-23 DIAGNOSIS — H93A9 Pulsatile tinnitus, unspecified ear: Secondary | ICD-10-CM

## 2023-08-23 DIAGNOSIS — I6523 Occlusion and stenosis of bilateral carotid arteries: Secondary | ICD-10-CM

## 2023-08-23 DIAGNOSIS — H8111 Benign paroxysmal vertigo, right ear: Secondary | ICD-10-CM

## 2023-08-23 NOTE — Progress Notes (Signed)
[facility] [place]  Patient name: Jason Guzman MRN: [id number] DOB: 02/16/1956 Sex: male   REASON FOR VISIT:    Follow up  HISOTRY OF PRESENT ILLNESS:    Jason Guzman is a 67 y.o. male, who met in 2023 for evaluation of asymptomatic carotid disease.  He had an ultrasound that showed moderate disease on the right and mild on the left.  His stenosis was detected on a ultrasound done for neck pain.  We discussed medical management of his stenosis given that he was asymptomatic.  He is on a statin for hypercholesterolemia.  His blood pressure is medically managed.  He is back today for follow-up.  He is without complaints.  He denies any neurologic symptoms such as numbness or weakness in either extremity, slurred speech, or amaurosis fugax.   The patient does take a statin for hypercholesterolemia.  He is medically managed for hypertension he is a non-smoker.   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  SOCIAL HISTORY:   Social History   Tobacco Use   Smoking status: Never   Smokeless tobacco: Never  Substance Use Topics   Alcohol use: No     ALLERGIES:   No Known Allergies   CURRENT MEDICATIONS:   Current Outpatient Medications  Medication Sig Dispense Refill   amLODipine (NORVASC) 5 MG tablet Take 1 tablet (5 mg total) by mouth daily. (Patient taking differently: Take 10 mg by mouth daily.) 30 tablet 0    atorvastatin (LIPITOR) 20 MG tablet Take 40 mg by mouth every evening.  2   lisinopril (ZESTRIL) 20 MG tablet Take 20 mg by mouth daily.     methocarbamol (ROBAXIN) 500 MG tablet Take 500-1,000 mg by mouth every 6 (six) hours as needed.     omega-3 acid ethyl esters (LOVAZA) 1 g capsule Take 1 capsule by mouth 2 (two) times daily.     omeprazole (PRILOSEC) 40 MG capsule Take 40 mg by mouth every morning.     sildenafil (REVATIO) 20 MG tablet SMARTSIG:2-3 Tablet(s) By Mouth Daily PRN     tamsulosin (FLOMAX) 0.4 MG CAPS capsule Take 0.4 mg by mouth daily.     No current facility-administered medications for this visit.    REVIEW OF SYSTEMS:   [X]  denotes positive finding, [ ]  denotes negative finding Cardiac  Comments:  Chest pain or chest pressure:    Shortness of breath upon exertion:    Short of breath when lying flat:    Irregular heart rhythm:        Vascular    Pain in calf, thigh, or hip brought on by ambulation:    Pain in feet at night that wakes you up from your sleep:     Blood clot in your veins:    Leg swelling:  Pulmonary    Oxygen at home:    Productive cough:     Wheezing:         Neurologic    Sudden weakness in arms or legs:     Sudden numbness in arms or legs:     Sudden onset of difficulty speaking or slurred speech:    Temporary loss of vision in one eye:     Problems with dizziness:         Gastrointestinal    Blood in stool:     Vomited blood:         Genitourinary    Burning when urinating:     Blood in urine:        Psychiatric    Major depression:         Hematologic    Bleeding problems:    Problems with blood clotting too easily:        Skin    Rashes or ulcers:        Constitutional    Fever or chills:      PHYSICAL EXAM:   Vitals:   08/23/23 1052  BP: 135/77  Pulse: 66  Resp: 18  Temp: 97.8 F (36.6 C)  TempSrc: Temporal  SpO2: 96%  Weight: 233 lb 11.2 oz (106 kg)  Height: 5\' 9"  (1.753 m)    GENERAL: The  patient is a well-nourished male, in no acute distress. The vital signs are documented above. CARDIAC: There is a regular rate and rhythm.  VASCULAR: No carotid bruits PULMONARY: Non-labored respirations ABDOMEN: Soft and non-tender with normal pitched bowel sounds.  MUSCULOSKELETAL: There are no major deformities or cyanosis. NEUROLOGIC: No focal weakness or paresthesias are detected. SKIN: There are no ulcers or rashes noted. PSYCHIATRIC: The patient has a normal affect.  STUDIES:   I have reviewed the following:  Right Carotid: Velocities in the right ICA are consistent with a 1-39%  stenosis.                Non-hemodynamically significant plaque <50% noted in the  CCA. The                 ECA appears >50% stenosed. Calcified plaque in the Carotid                 bifurcation.   Left Carotid: Velocities in the left ICA are consistent with a 1-39%  stenosis.               Non-hemodynamically significant plaque <50% noted in the  CCA.   Vertebrals: Bilateral vertebral arteries demonstrate antegrade flow.  Subclavians: Normal flow hemodynamics were seen in bilateral subclavian               arteries.  MEDICAL ISSUES:   Carotid stenosis: The patient remains asymptomatic.  Velocity profile remains less than 80%.  We will continue with surveillance imaging.  His next study will be in 1 year.  He will continue his current medical regimen which has been optimized for cardiovascular disease    Charlena Cross, MD, FACS [facility] [phone] Pager [phone]
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
## 2023-08-30 ENCOUNTER — Ambulatory Visit
Admission: RE | Admit: 2023-08-30 | Discharge: 2023-08-30 | Disposition: A | Discharge status: Home / Self Care | Payer: 59 | Source: Ambulatory Visit | Attending: Family Medicine | Admitting: Family Medicine

## 2023-08-30 DIAGNOSIS — R519 Headache, unspecified: Secondary | ICD-10-CM

## 2023-09-03 ENCOUNTER — Other Ambulatory Visit: Payer: Self-pay

## 2023-09-03 DIAGNOSIS — I6523 Occlusion and stenosis of bilateral carotid arteries: Secondary | ICD-10-CM
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
## 2023-09-10 ENCOUNTER — Ambulatory Visit: Payer: 59 | Admitting: Neurology

## 2023-09-10 ENCOUNTER — Encounter: Payer: Self-pay | Admitting: Neurology

## 2023-09-10 VITALS — BP 146/83 | HR 65 | Ht 69.0 in | Wt 240.0 lb

## 2023-09-10 DIAGNOSIS — M542 Cervicalgia: Secondary | ICD-10-CM

## 2023-09-10 DIAGNOSIS — G44209 Tension-type headache, unspecified, not intractable: Secondary | ICD-10-CM | POA: Diagnosis not present

## 2023-09-10 NOTE — Patient Instructions (Signed)
I think it is tension headache from the neck.  I do not suspect anything serious. Options include: Not wearing hat Physical therapy of neck Starting an antidepressant or anti-seizure medication for headache prevention  See how you do after retirement when you don't need to wear the hat.  Otherwise, follow up as needed.

## 2023-11-19 DIAGNOSIS — R1032 Left lower quadrant pain: Secondary | ICD-10-CM | POA: Diagnosis not present

## 2023-11-19 DIAGNOSIS — T148XXA Other injury of unspecified body region, initial encounter: Secondary | ICD-10-CM | POA: Diagnosis not present

## 2023-11-26 DIAGNOSIS — I10 Essential (primary) hypertension: Secondary | ICD-10-CM | POA: Diagnosis not present

## 2023-11-26 DIAGNOSIS — K219 Gastro-esophageal reflux disease without esophagitis: Secondary | ICD-10-CM | POA: Diagnosis not present

## 2023-11-26 DIAGNOSIS — Z6832 Body mass index (BMI) 32.0-32.9, adult: Secondary | ICD-10-CM | POA: Diagnosis not present

## 2023-11-26 DIAGNOSIS — Z008 Encounter for other general examination: Secondary | ICD-10-CM | POA: Diagnosis not present

## 2023-11-26 DIAGNOSIS — D696 Thrombocytopenia, unspecified: Secondary | ICD-10-CM | POA: Diagnosis not present

## 2023-11-26 DIAGNOSIS — Z860100 Personal history of colon polyps, unspecified: Secondary | ICD-10-CM | POA: Diagnosis not present

## 2023-11-26 DIAGNOSIS — E669 Obesity, unspecified: Secondary | ICD-10-CM | POA: Diagnosis not present

## 2023-11-26 DIAGNOSIS — N401 Enlarged prostate with lower urinary tract symptoms: Secondary | ICD-10-CM | POA: Diagnosis not present

## 2023-11-26 DIAGNOSIS — E785 Hyperlipidemia, unspecified: Secondary | ICD-10-CM | POA: Diagnosis not present

## 2024-02-07 DIAGNOSIS — I6529 Occlusion and stenosis of unspecified carotid artery: Secondary | ICD-10-CM | POA: Diagnosis not present

## 2024-02-07 DIAGNOSIS — R42 Dizziness and giddiness: Secondary | ICD-10-CM | POA: Diagnosis not present

## 2024-02-07 DIAGNOSIS — E7800 Pure hypercholesterolemia, unspecified: Secondary | ICD-10-CM | POA: Diagnosis not present

## 2024-02-07 DIAGNOSIS — R519 Headache, unspecified: Secondary | ICD-10-CM | POA: Diagnosis not present

## 2024-02-07 DIAGNOSIS — K22719 Barrett's esophagus with dysplasia, unspecified: Secondary | ICD-10-CM | POA: Diagnosis not present

## 2024-02-07 DIAGNOSIS — Z6834 Body mass index (BMI) 34.0-34.9, adult: Secondary | ICD-10-CM | POA: Diagnosis not present

## 2024-02-07 DIAGNOSIS — I10 Essential (primary) hypertension: Secondary | ICD-10-CM | POA: Diagnosis not present

## 2024-04-04 DIAGNOSIS — Z008 Encounter for other general examination: Secondary | ICD-10-CM | POA: Diagnosis not present

## 2024-04-04 DIAGNOSIS — E785 Hyperlipidemia, unspecified: Secondary | ICD-10-CM | POA: Diagnosis not present

## 2024-04-04 DIAGNOSIS — N401 Enlarged prostate with lower urinary tract symptoms: Secondary | ICD-10-CM | POA: Diagnosis not present

## 2024-04-04 DIAGNOSIS — M1990 Unspecified osteoarthritis, unspecified site: Secondary | ICD-10-CM | POA: Diagnosis not present

## 2024-04-04 DIAGNOSIS — I10 Essential (primary) hypertension: Secondary | ICD-10-CM | POA: Diagnosis not present

## 2024-04-04 DIAGNOSIS — E669 Obesity, unspecified: Secondary | ICD-10-CM | POA: Diagnosis not present

## 2024-04-04 DIAGNOSIS — K219 Gastro-esophageal reflux disease without esophagitis: Secondary | ICD-10-CM | POA: Diagnosis not present

## 2024-04-04 DIAGNOSIS — Z6833 Body mass index (BMI) 33.0-33.9, adult: Secondary | ICD-10-CM | POA: Diagnosis not present

## 2024-05-12 DIAGNOSIS — E7800 Pure hypercholesterolemia, unspecified: Secondary | ICD-10-CM | POA: Diagnosis not present

## 2024-06-20 ENCOUNTER — Other Ambulatory Visit: Payer: Self-pay

## 2024-06-20 ENCOUNTER — Emergency Department (HOSPITAL_COMMUNITY)
Admission: EM | Admit: 2024-06-20 | Discharge: 2024-06-21 | Disposition: A | Discharge status: Home / Self Care | Payer: Self-pay | Attending: Emergency Medicine | Admitting: Emergency Medicine

## 2024-06-20 ENCOUNTER — Encounter (HOSPITAL_COMMUNITY): Payer: Self-pay

## 2024-06-20 DIAGNOSIS — R1032 Left lower quadrant pain: Secondary | ICD-10-CM | POA: Diagnosis present

## 2024-06-20 DIAGNOSIS — K4090 Unilateral inguinal hernia, without obstruction or gangrene, not specified as recurrent: Secondary | ICD-10-CM | POA: Diagnosis not present

## 2024-06-20 DIAGNOSIS — K4091 Unilateral inguinal hernia, without obstruction or gangrene, recurrent: Secondary | ICD-10-CM | POA: Diagnosis not present

## 2024-06-20 DIAGNOSIS — K8020 Calculus of gallbladder without cholecystitis without obstruction: Secondary | ICD-10-CM | POA: Diagnosis not present

## 2024-06-20 DIAGNOSIS — Z79899 Other long term (current) drug therapy: Secondary | ICD-10-CM | POA: Insufficient documentation

## 2024-06-20 DIAGNOSIS — R161 Splenomegaly, not elsewhere classified: Secondary | ICD-10-CM | POA: Diagnosis not present

## 2024-06-20 DIAGNOSIS — R160 Hepatomegaly, not elsewhere classified: Secondary | ICD-10-CM | POA: Insufficient documentation

## 2024-06-20 DIAGNOSIS — K760 Fatty (change of) liver, not elsewhere classified: Secondary | ICD-10-CM | POA: Diagnosis not present

## 2024-06-20 DIAGNOSIS — I10 Essential (primary) hypertension: Secondary | ICD-10-CM | POA: Insufficient documentation

## 2024-06-20 NOTE — ED Triage Notes (Signed)
 Patient c/o left testicle pain x 1 month, no swelling. Patient reports that it's radiating up now and so he wanted to get it checked out.

## 2024-06-20 NOTE — ED Triage Notes (Signed)
 Pt states his left testicle has been hurting for the past month. Today when he felt above his testicle he noticed a pain when pushing.   Pt denies N/V and/or swelling.

## 2024-06-21 ENCOUNTER — Emergency Department (HOSPITAL_COMMUNITY): Payer: Self-pay

## 2024-06-21 LAB — URINALYSIS, ROUTINE W REFLEX MICROSCOPIC
Bilirubin Urine: NEGATIVE
Glucose, UA: NEGATIVE mg/dL
Hgb urine dipstick: NEGATIVE
Ketones, ur: NEGATIVE mg/dL
Leukocytes,Ua: NEGATIVE
Nitrite: NEGATIVE
Protein, ur: NEGATIVE mg/dL
Specific Gravity, Urine: 1.01 (ref 1.005–1.030)
pH: 6 (ref 5.0–8.0)

## 2024-06-21 LAB — COMPREHENSIVE METABOLIC PANEL WITH GFR
ALT: 19 U/L (ref 0–44)
AST: 22 U/L (ref 15–41)
Albumin: 3.9 g/dL (ref 3.5–5.0)
Alkaline Phosphatase: 70 U/L (ref 38–126)
Anion gap: 9 (ref 5–15)
BUN: 8 mg/dL (ref 8–23)
CO2: 25 mmol/L (ref 22–32)
Calcium: 8.7 mg/dL — ABNORMAL LOW (ref 8.9–10.3)
Chloride: 106 mmol/L (ref 98–111)
Creatinine, Ser: 1.02 mg/dL (ref 0.61–1.24)
GFR, Estimated: >60 mL/min (ref >60)
Glucose, Bld: 107 mg/dL — ABNORMAL HIGH (ref 70–99)
Potassium: 4.1 mmol/L (ref 3.5–5.1)
Sodium: 140 mmol/L (ref 135–145)
Total Bilirubin: 1.1 mg/dL (ref 0.0–1.2)
Total Protein: 6.3 g/dL — ABNORMAL LOW (ref 6.5–8.1)

## 2024-06-21 LAB — CBC WITH DIFFERENTIAL/PLATELET
Abs Immature Granulocytes: 0.02 K/uL (ref 0.00–0.07)
Basophils Absolute: 0 K/uL (ref 0.0–0.1)
Basophils Relative: 0 %
Eosinophils Absolute: 0.1 K/uL (ref 0.0–0.5)
Eosinophils Relative: 2 %
HCT: 39.9 % (ref 39.0–52.0)
Hemoglobin: 13.3 g/dL (ref 13.0–17.0)
Immature Granulocytes: 0 %
Lymphocytes Relative: 26 %
Lymphs Abs: 1.5 K/uL (ref 0.7–4.0)
MCH: 29.4 pg (ref 26.0–34.0)
MCHC: 33.3 g/dL (ref 30.0–36.0)
MCV: 88.1 fL (ref 80.0–100.0)
Monocytes Absolute: 0.3 K/uL (ref 0.1–1.0)
Monocytes Relative: 6 %
Neutro Abs: 3.9 K/uL (ref 1.7–7.7)
Neutrophils Relative %: 66 %
Platelets: 142 K/uL — ABNORMAL LOW (ref 150–400)
RBC: 4.53 MIL/uL (ref 4.22–5.81)
RDW: 12.4 % (ref 11.5–15.5)
WBC: 5.9 K/uL (ref 4.0–10.5)
nRBC: 0 % (ref 0.0–0.2)

## 2024-06-21 MED ORDER — IOHEXOL 350 MG/ML SOLN
100.0000 mL | Freq: Once | INTRAVENOUS | Status: AC | PRN
  Administered 2024-06-21: 100 mL via INTRAVENOUS

## 2024-06-21 NOTE — ED Notes (Signed)
 Pharmacy was called and notified of the infiltration. Pharmacist says to continue w/ warm or cold compress depending on pts comfort. If arm becomes worse and/or mottled to contact Pharmacy back for medication intervention.

## 2024-06-21 NOTE — Discharge Instructions (Addendum)
 You were seen in the ER today for evaluation of your symptoms. You have an inguinal hernia. I have included more information into the discharge paperwork.  Please make sure you call the general surgeon to schedule an appointment.  Additionally, there was some ancillary findings of your imaging.  Please follow-up with your primary care regarding these.  If you have any concerns, new or worsening symptoms, please return to the ER for re-evaluation.   Contact a health care provider if: You have a fever. You have new symptoms. Your symptoms get worse. You can't poop or pass gas. Get help right away if: Your bulge: Starts to hurt a lot. Changes color. You have sudden pain in your scrotum, or the size of your scrotum changes all of a sudden. You can't gently push the hernia back in place. You keep throwing up or feeling like you need to throw up. These symptoms may be an emergency. Call 911 right away. Do not wait to see if the symptoms will go away. Do not drive yourself to the hospital.

## 2024-06-21 NOTE — ED Notes (Signed)
 Patient Alert and oriented to baseline. Stable and ambulatory to baseline. Patient verbalized understanding of the discharge instructions.  Patient belongings were taken by the patient.

## 2024-06-21 NOTE — ED Notes (Signed)
 Pt returned from CT. CT tech notified RN of IV infiltration during CT scan. Warm compress was applied in CT, arm elevated, and IV was also removed by CT tech.

## 2024-06-21 NOTE — ED Provider Notes (Signed)
 [place] [facility] AT [facility] Provider Note   CSN: [id number] Arrival date & time: 06/20/24  1918     Patient presents with: Testicle Pain   Jason Guzman is a 68 y.o. male with history of hypertension and hyperlipidemia presents the emerged from today for evaluation of left groin pain for the past month.  He reports that he has been feeling pain/swelling it some into his scrotum.  He reports that now he noticed that the pain was present whenever he pushes the base of his penis.  Denies any actual testicle pain.  Denies any dysuria, hematuria, difficulty urinating, diarrhea, constipation, abdominal pain.  He denies any recent heavy lifting or known trauma to the area.  Present today as he was concerned given that he was pushing at the base of his penis and was having some pain.   Testicle Pain Pertinent negatives include no chest pain, no abdominal pain and no shortness of breath.       Prior to Admission medications   Medication Sig Start Date End Date Taking? Authorizing Provider  amLODipine  (NORVASC ) 5 MG tablet Take 1 tablet (5 mg total) by mouth daily. Patient taking differently: Take 10 mg by mouth daily. 12/15/22   Garrick Charleston, MD  atorvastatin (LIPITOR) 20 MG tablet Take 40 mg by mouth every evening. 06/09/17   [provider]  lisinopril (ZESTRIL) 20 MG tablet Take 20 mg by mouth daily. 07/06/22   [provider]  methocarbamol (ROBAXIN) 500 MG tablet Take 500-1,000 mg by mouth every 6 (six) hours as needed. 05/25/22   [provider]  omeprazole (PRILOSEC) 40 MG capsule Take 40 mg by mouth every morning. 07/19/22   [provider]  sildenafil (REVATIO) 20 MG tablet SMARTSIG:2-3 Tablet(s) By Mouth Daily PRN 07/19/22   [provider]  tamsulosin  (FLOMAX ) 0.4 MG CAPS capsule Take 0.4 mg by mouth daily. 07/06/22   [provider]    Allergies: Patient has no known allergies.    Review of Systems   Constitutional:  Negative for chills and fever.  Respiratory:  Negative for shortness of breath.   Cardiovascular:  Negative for chest pain.  Gastrointestinal:  Negative for abdominal pain, blood in stool, constipation, diarrhea, nausea and vomiting.  Genitourinary:  Negative for difficulty urinating, dysuria, frequency, hematuria, penile discharge, penile pain, penile swelling, scrotal swelling and testicular pain.       Reports groin pain    Updated Vital Signs BP 139/72   Pulse 70   Temp 98.2 F (36.8 C) (Oral)   Resp 16   Ht 5' 9 (1.753 m)   Wt 104.3 kg   SpO2 98%   BMI 33.97 kg/m   Physical Exam Vitals and nursing note reviewed. Exam conducted with a chaperone present Fortunato, RN).  Constitutional:      General: He is not in acute distress.    Appearance: He is not ill-appearing or toxic-appearing.  Eyes:     General: No scleral icterus. Cardiovascular:     Rate and Rhythm: Normal rate.  Pulmonary:     Effort: Pulmonary effort is normal. No respiratory distress.  Abdominal:     Palpations: Abdomen is soft.     Tenderness: There is no abdominal tenderness. There is no guarding or rebound.     Hernia: A hernia is present. Hernia is present in the left inguinal area.  Genitourinary:    Penis: Normal.      Testes:  Right: Mass or tenderness not present.        Left: Mass or tenderness not present.     Epididymis:     Right: Normal.     Left: Normal.       Comments: Bulge seen to the left of the mons pubis to the marked area above.  Soft.  No overlying erythema or increase in warmth.  Nonpulsatile.  Testicles with normal lie.  Nontender.   Lymphadenopathy:     Lower Body: No left inguinal adenopathy.  Skin:    General: Skin is warm and dry.  Neurological:     Mental Status: He is alert.     (all labs ordered are listed, but only abnormal results are displayed) Labs Reviewed  CBC WITH DIFFERENTIAL/PLATELET - Abnormal; Notable for the following  components:      Result Value   Platelets 142 (*)    All other components within normal limits  COMPREHENSIVE METABOLIC PANEL WITH GFR - Abnormal; Notable for the following components:   Glucose, Bld 107 (*)    Calcium 8.7 (*)    Total Protein 6.3 (*)    All other components within normal limits  URINALYSIS, ROUTINE W REFLEX MICROSCOPIC    EKG: None  Radiology: CT ABDOMEN PELVIS W CONTRAST Result Date: 06/21/2024 EXAM: CT ABDOMEN AND PELVIS WITH CONTRAST 06/21/2024 04:18:43 AM TECHNIQUE: CT of the abdomen and pelvis was performed with the administration of intravenous contrast (100mL iohexol  (OMNIPAQUE ) 350 MG/ML injection). Multiplanar reformatted images are provided for review. Automated exposure control, iterative reconstruction, and/or weight based adjustment of the mA/kV was utilized to reduce the radiation dose to as low as reasonably achievable. COMPARISON: CT of the abdomen and pelvis 05/30/2019. CLINICAL HISTORY: Hernia suspected, inguinal or femoral; left question hernia. FINDINGS: LOWER CHEST: No acute abnormality. LIVER: Mild fatty infiltration of the liver is noted. GALLBLADDER AND BILE DUCTS: A 10 mm gallstone is present. No wall thickening or inflammatory changes are present to suggest cholecystitis. SPLEEN: The spleen is mildly enlarged. No discrete lesions are present. PANCREAS: No acute abnormality. ADRENAL GLANDS: No acute abnormality. KIDNEYS, URETERS AND BLADDER: A 13 mm simple cyst is noted anteriorly within the left kidney. Per consensus, no follow-up is needed for simple Bosniak type 1 and 2 renal cysts, unless the patient has a malignancy history or risk factors. No stones in the kidneys or ureters. No hydronephrosis. No perinephric or periureteral stranding. Urinary bladder is unremarkable. GI AND BOWEL: Stomach demonstrates no acute abnormality. There is no bowel obstruction. No bowel wall thickening. PERITONEUM AND RETROPERITONEUM: No ascites. No free air. VASCULATURE:  Atherosclerotic calcifications are present in the aorta and branch vessels. No aneurysm is present. LYMPH NODES: No lymphadenopathy. REPRODUCTIVE ORGANS: No acute abnormality. BONES AND SOFT TISSUES: Fat herniates into the left inguinal canal. No associated bowel is present. No other significant ventral hernias are present. Slight degenerative retrolisthesis at L1-2 is stable. Fused anterior osteophytes are present in the lower thoracic spine. IMPRESSION: 1. Fat herniation into the left inguinal canal without associated bowel. 2. 10 mm gallstone without evidence of cholecystitis. 3. Mild hepatic steatosis. 4. Mildly enlarged spleen without discrete lesions. Electronically signed by: Lonni Necessary MD 06/21/2024 04:49 AM EDT RP Workstation: [id number]   Procedures   Medications Ordered in the ED  iohexol  (OMNIPAQUE ) 350 MG/ML injection 100 mL (100 mLs Intravenous Contrast Given 06/21/24 0422)  Medical Decision Making Amount and/or Complexity of Data Reviewed Labs: ordered. Radiology: ordered.  Risk Prescription drug management.   68 y.o. male presents to the ER for evaluation of left mons pubis bulge. Differential diagnosis includes but is not limited to hernia, scrotal varicocele, incarcerated hernia, strangulated hernia, fat hernia, cellulitis, testicular torsion, epididymitis, malignancy. Vital signs unremarkable. Physical exam as noted above.   I independently reviewed and interpreted the patient's labs. Urinalysis unremarkable.  CBC shows mild thrombocytopenia at 142.  No leukocytosis or anemia.  CMP shows glucose 107, calcium 8.7 with a total protein 6.3.  No other electrolyte or LFT abnormalities.  CT abd pelvis 1. Fat herniation into the left inguinal canal without associated bowel. 2. 10 mm gallstone without evidence of cholecystitis. 3. Mild hepatic steatosis. 4. Mildly enlarged spleen without discrete lesions. Per radiologist's interpretation.     Unfortunately while in the CT contrast the dye extravasated into the arm.  Patient is having some swelling at the site.  Patient reports the arm feels tight but no significant pain.  No paresthesias.  Palpable distal pulses.  Good grip strength.  There is a hot pack present.  I have elevated patient's arm.  He will need reevaluation of the arm to make sure that this is improved.  Patient is aware of plan to follow-up with general surgery and follow-up with PCP for ancillary findings such as the multiple medically, fatty liver, etc.  Hernia soft, fat-containing no containing bowel.  No incarceration or strangulation. I do not think the patient needs any ultrasound of the testicles give this to the plan for a month any he does not have any tenderness to the testicle.  No overlying skin changes, swelling, erythema, or increase in warmth.  Doubt testicular torsion.  CT showing likely cause of his symptoms.  7:07 AM Care of Jason Guzman transferred to PA Norleen Essex at the end of my shift as the patient will require reassessment once labs/imaging have resulted. Patient presentation, ED course, and plan of care discussed with review of all pertinent labs and imaging. Please see his/her note for further details regarding further ED course and disposition. Plan at time of handoff is re-evaluate arm, if improved, can be discharged home with general surgery follow up. This may be altered or completely changed at the discretion of the oncoming team pending results of further workup.   Portions of this report may have been transcribed using voice recognition software. Every effort was made to ensure accuracy; however, inadvertent computerized transcription errors may be present.    Final diagnoses:  Calculus of gallbladder without cholecystitis without obstruction  Inguinal hernia of left side without obstruction or gangrene  Fatty liver  Splenomegaly    ED Discharge Orders     None          Bernis Ernst, PA-C 06/21/24 [id number]    Melvenia Motto, MD 06/21/24 [phone]

## 2024-06-23 ENCOUNTER — Ambulatory Visit: Payer: Self-pay | Admitting: Surgery

## 2024-06-23 DIAGNOSIS — K4090 Unilateral inguinal hernia, without obstruction or gangrene, not specified as recurrent: Secondary | ICD-10-CM | POA: Diagnosis not present

## 2024-06-23 NOTE — H&P (Signed)
 History of Present Illness: Jason Guzman is a 68 y.o. male who is seen today as an office consultation for evaluation of New Consultation (Eval of [facility])   He was recently seen in the ED on 7/23 with pain in the left groin. He had been having left groin pain for about a month. A CT scan was done and showed fat within a left inguinal hernia but no bowel. He says he is still having frequent pain which radiates down to the scrotum. He has not noticed a bulge.   He has not had any prior abdominal surgeries. Current BMI is 34.8. He has never smoked. He takes a baby aspirin daily but no other blood thinners.       Review of Systems: A complete review of systems was obtained from the patient.  I have reviewed this information and discussed as appropriate with the patient.  See HPI as well for other ROS.     Medical History: Past Medical History Past Medical History: Diagnosis Date  Arthritis    GERD (gastroesophageal reflux disease)    Hyperlipidemia    Hypertension         Problem List There is no problem list on file for this patient.     Past Surgical History Past Surgical History: Procedure Laterality Date  Closed reduction and external fixator for complex fx   12/08/2005  Shoulder Surgery   2022      Allergies Allergies Allergen Reactions  Hydrochlorothiazide Rash      Medications Ordered Prior to Encounter Current Outpatient Medications on File Prior to Visit Medication Sig Dispense Refill  amLODIPine  (NORVASC ) 10 MG tablet 1 tablet Orally Once a day; Duration: 90 days      aspirin 81 MG chewable tablet 1 tablet Orally Once a day; Duration: 30 days      atorvastatin (LIPITOR) 80 MG tablet 1 tablet Orally Once a day; Duration: 90 days      lisinopriL (ZESTRIL) 40 MG tablet 1 tablet Orally Once a day; Duration: 100 days      omeprazole (PRILOSEC) 40 MG DR capsule Take 40 mg by mouth every morning      tamsulosin  (FLOMAX ) 0.4 mg capsule Take 0.4 mg by mouth once daily         No current facility-administered medications on file prior to visit.      Family History Family History Problem Relation Age of Onset  High blood pressure (Hypertension) Father    Hyperlipidemia (Elevated cholesterol) Father    Diabetes Father    High blood pressure (Hypertension) Sister    Hyperlipidemia (Elevated cholesterol) Sister        Tobacco Use History Social History    Tobacco Use Smoking Status Never Smokeless Tobacco Never      Social History Social History    Socioeconomic History  Marital status: Married Tobacco Use  Smoking status: Never  Smokeless tobacco: Never Vaping Use  Vaping status: Never Used Substance and Sexual Activity  Alcohol use: Never  Drug use: Never    Social Drivers of Health    Housing Stability: Unknown (06/23/2024)   Housing Stability Vital Sign    Homeless in the Last Year: No      Objective:   Vitals:   06/23/24 1519 06/23/24 1520 BP: 126/74   Pulse: 85   Temp: 36.7 C (98 F)   SpO2: 96%   Weight: (!) 107.1 kg (236 lb 3.2 oz)   Height: 175.3 cm (5' 9)   PainSc:  3 PainLoc:   Groin   Body mass index is 34.88 kg/m.   Physical Exam Vitals reviewed.  Constitutional:      General: He is not in acute distress.    Appearance: Normal appearance.  HENT:     Head: Normocephalic and atraumatic.  Cardiovascular:     Rate and Rhythm: Normal rate and regular rhythm.  Pulmonary:     Effort: Pulmonary effort is normal. No respiratory distress.     Breath sounds: Normal breath sounds. No wheezing.  Abdominal:     General: There is no distension.     Palpations: Abdomen is soft.  Genitourinary:    Comments: No discrete bulge in the bilateral groins. Left inguinal hernia is difficult to appreciate on exam. Skin:    General: Skin is warm and dry.     Coloration: Skin is not jaundiced.  Neurological:     General: No focal deficit present.     Mental Status: He is alert and oriented to person, place,  and time.           Labs, Imaging and Diagnostic Testing: IMPRESSION: 1. Fat herniation into the left inguinal canal without associated bowel. 2. 10 mm gallstone without evidence of cholecystitis. 3. Mild hepatic steatosis. 4. Mildly enlarged spleen without discrete lesions.     Assessment and Plan:   Assessment Diagnoses and all orders for this visit:   Left inguinal hernia     68 yo male with left groin pain and a small left inguinal hernia, confirmed on CT scan. He is having frequent pain.  I reviewed the details of robotic-assisted laparoscopic left inguinal hernia repair with mesh.  I discussed the benefits and the risks of bleeding, infection, damage to surrounding structures, and chronic inguinodynia.  We also reviewed the anticipated postoperative recovery course and restrictions.  He expressed understanding and agrees to proceed with surgery.  He will be contacted to schedule an elective surgery date.  He does not need to hold his aspirin for surgery.  Leonor Dawn, MD [facility] Surgery General, Hepatobiliary and Pancreatic Surgery 06/23/24 4:06 PM

## 2024-06-23 NOTE — H&P (View-Only) (Signed)
 History of Present Illness: Jason Guzman is a 68 y.o. male who is seen today as an office consultation for evaluation of New Consultation (Eval of [facility])   He was recently seen in the ED on 7/23 with pain in the left groin. He had been having left groin pain for about a month. A CT scan was done and showed fat within a left inguinal hernia but no bowel. He says he is still having frequent pain which radiates down to the scrotum. He has not noticed a bulge.   He has not had any prior abdominal surgeries. Current BMI is 34.8. He has never smoked. He takes a baby aspirin daily but no other blood thinners.       Review of Systems: A complete review of systems was obtained from the patient.  I have reviewed this information and discussed as appropriate with the patient.  See HPI as well for other ROS.     Medical History: Past Medical History Past Medical History: Diagnosis Date  Arthritis    GERD (gastroesophageal reflux disease)    Hyperlipidemia    Hypertension         Problem List There is no problem list on file for this patient.     Past Surgical History Past Surgical History: Procedure Laterality Date  Closed reduction and external fixator for complex fx   12/08/2005  Shoulder Surgery   2022      Allergies Allergies Allergen Reactions  Hydrochlorothiazide Rash      Medications Ordered Prior to Encounter Current Outpatient Medications on File Prior to Visit Medication Sig Dispense Refill  amLODIPine  (NORVASC ) 10 MG tablet 1 tablet Orally Once a day; Duration: 90 days      aspirin 81 MG chewable tablet 1 tablet Orally Once a day; Duration: 30 days      atorvastatin (LIPITOR) 80 MG tablet 1 tablet Orally Once a day; Duration: 90 days      lisinopriL (ZESTRIL) 40 MG tablet 1 tablet Orally Once a day; Duration: 100 days      omeprazole (PRILOSEC) 40 MG DR capsule Take 40 mg by mouth every morning      tamsulosin  (FLOMAX ) 0.4 mg capsule Take 0.4 mg by mouth once daily         No current facility-administered medications on file prior to visit.      Family History Family History Problem Relation Age of Onset  High blood pressure (Hypertension) Father    Hyperlipidemia (Elevated cholesterol) Father    Diabetes Father    High blood pressure (Hypertension) Sister    Hyperlipidemia (Elevated cholesterol) Sister        Tobacco Use History Social History    Tobacco Use Smoking Status Never Smokeless Tobacco Never      Social History Social History    Socioeconomic History  Marital status: Married Tobacco Use  Smoking status: Never  Smokeless tobacco: Never Vaping Use  Vaping status: Never Used Substance and Sexual Activity  Alcohol use: Never  Drug use: Never    Social Drivers of Health    Housing Stability: Unknown (06/23/2024)   Housing Stability Vital Sign    Homeless in the Last Year: No      Objective:   Vitals:   06/23/24 1519 06/23/24 1520 BP: 126/74   Pulse: 85   Temp: 36.7 C (98 F)   SpO2: 96%   Weight: (!) 107.1 kg (236 lb 3.2 oz)   Height: 175.3 cm (5' 9)   PainSc:  3 PainLoc:   Groin   Body mass index is 34.88 kg/m.   Physical Exam Vitals reviewed.  Constitutional:      General: He is not in acute distress.    Appearance: Normal appearance.  HENT:     Head: Normocephalic and atraumatic.  Cardiovascular:     Rate and Rhythm: Normal rate and regular rhythm.  Pulmonary:     Effort: Pulmonary effort is normal. No respiratory distress.     Breath sounds: Normal breath sounds. No wheezing.  Abdominal:     General: There is no distension.     Palpations: Abdomen is soft.  Genitourinary:    Comments: No discrete bulge in the bilateral groins. Left inguinal hernia is difficult to appreciate on exam. Skin:    General: Skin is warm and dry.     Coloration: Skin is not jaundiced.  Neurological:     General: No focal deficit present.     Mental Status: He is alert and oriented to person, place,  and time.           Labs, Imaging and Diagnostic Testing: IMPRESSION: 1. Fat herniation into the left inguinal canal without associated bowel. 2. 10 mm gallstone without evidence of cholecystitis. 3. Mild hepatic steatosis. 4. Mildly enlarged spleen without discrete lesions.     Assessment and Plan:   Assessment Diagnoses and all orders for this visit:   Left inguinal hernia     68 yo male with left groin pain and a small left inguinal hernia, confirmed on CT scan. He is having frequent pain.  I reviewed the details of robotic-assisted laparoscopic left inguinal hernia repair with mesh.  I discussed the benefits and the risks of bleeding, infection, damage to surrounding structures, and chronic inguinodynia.  We also reviewed the anticipated postoperative recovery course and restrictions.  He expressed understanding and agrees to proceed with surgery.  He will be contacted to schedule an elective surgery date.  He does not need to hold his aspirin for surgery.  Leonor Dawn, MD [facility] Surgery General, Hepatobiliary and Pancreatic Surgery 06/23/24 4:06 PM

## 2024-06-28 DIAGNOSIS — Z6834 Body mass index (BMI) 34.0-34.9, adult: Secondary | ICD-10-CM | POA: Diagnosis not present

## 2024-06-28 DIAGNOSIS — I10 Essential (primary) hypertension: Secondary | ICD-10-CM | POA: Diagnosis not present

## 2024-06-28 DIAGNOSIS — R161 Splenomegaly, not elsewhere classified: Secondary | ICD-10-CM | POA: Diagnosis not present

## 2024-07-06 NOTE — Patient Instructions (Signed)
 SURGICAL WAITING ROOM VISITATION Patients having surgery or a procedure may have no more than 2 support people in the waiting area - these visitors may rotate in the visitor waiting room.   If the patient needs to stay at the hospital during part of their recovery, the visitor guidelines for inpatient rooms apply.  PRE-OP VISITATION  Pre-op nurse will coordinate an appropriate time for 1 support person to accompany the patient in pre-op.  This support person may not rotate.  This visitor will be contacted when the time is appropriate for the visitor to come back in the pre-op area.  Please refer to the [facility] website for the visitor guidelines for Inpatients (after your surgery is over and you are in a regular room).  You are not required to quarantine at this time prior to your surgery. However, you must do this: Hand Hygiene often Do NOT share personal items Notify your provider if you are in close contact with someone who has COVID or you develop fever 100.4 or greater, new onset of sneezing, cough, sore throat, shortness of breath or body aches.  If you test positive for Covid or have been in contact with anyone that has tested positive in the last 10 days please notify you surgeon.    Your procedure is scheduled on:  Friday  July 14, 2024  Report to [facility] Main Entrance: [place] entrance where the [employer] is available.   Report to admitting at:  08:15   AM  Call this number if you have any questions or problems the morning of surgery [phone]  FOLLOW ANY ADDITIONAL PRE OP INSTRUCTIONS YOU RECEIVED FROM YOUR SURGEON'S OFFICE!!!  Do not eat food after Midnight the night prior to your surgery/procedure.  After Midnight you may have the following liquids until  07:30 AM DAY OF SURGERY  Clear Liquid Diet Water Black Coffee (sugar ok, NO MILK/CREAM OR CREAMERS)  Tea (sugar ok, NO MILK/CREAM OR CREAMERS) regular and decaf                              Plain Jell-O  with no fruit (NO RED)                                           Fruit ices (not with fruit pulp, NO RED)                                     Popsicles (NO RED)                                                                  Juice: NO CITRUS JUICES: only apple, WHITE grape, WHITE cranberry Sports drinks like Gatorade or Powerade (NO RED)                 Oral Hygiene is also important to reduce your risk of infection.        Remember - BRUSH YOUR TEETH THE MORNING OF SURGERY WITH YOUR REGULAR TOOTHPASTE  Do NOT  smoke after Midnight the night before surgery.  STOP TAKING all Vitamins, Herbs and supplements 1 week before your surgery.   Take ONLY these medicines the morning of surgery with A SIP OF WATER: omeprazole, amlodipine .   DO NOT TAKE Lisinopril the morning of your surgery.                      You may not have any metal on your body including, jewelry, and body piercing  Do not wear  lotions, powders,  cologne, or deodorant  Men may shave face and neck.  Contacts, Hearing Aids, dentures or bridgework may not be worn into surgery. DENTURES WILL BE REMOVED PRIOR TO SURGERY PLEASE DO NOT APPLY Poly grip OR ADHESIVES!!!  Patients discharged on the day of surgery will not be allowed to drive home.  Someone NEEDS to stay with you for the first 24 hours after anesthesia.  Do not bring your home medications to the hospital. The Pharmacy will dispense medications listed on your medication list to you during your admission in the Hospital.  Please read over the following fact sheets you were given: IF YOU HAVE QUESTIONS ABOUT YOUR PRE-OP INSTRUCTIONS, PLEASE CALL [phone]   [facility] - Preparing for Surgery Before surgery, you can play an important role.  Because skin is not sterile, your skin needs to be as free of germs as possible.  You can reduce the number of germs on your skin by washing with CHG (chlorahexidine gluconate) soap before surgery.  CHG is an  antiseptic cleaner which kills germs and bonds with the skin to continue killing germs even after washing. Please DO NOT use if you have an allergy to CHG or antibacterial soaps.  If your skin becomes reddened/irritated stop using the CHG and inform your nurse when you arrive at Short Stay. Do not shave (including legs and underarms) for at least 48 hours prior to the first CHG shower.  You may shave your face/neck.  Please follow these instructions carefully:  1.  Shower with CHG Soap the night before surgery and the  morning of surgery.  2.  If you choose to wash your hair, wash your hair first as usual with your normal  shampoo.  3.  After you shampoo, rinse your hair and body thoroughly to remove the shampoo.                             4.  Use CHG as you would any other liquid soap.  You can apply chg directly to the skin and wash.  Gently with a scrungie or clean washcloth.  5.  Apply the CHG Soap to your body ONLY FROM THE NECK DOWN.   Do not use on face/ open                           Wound or open sores. Avoid contact with eyes, ears mouth and genitals (private parts).                       Wash face,  Genitals (private parts) with your normal soap.             6.  Wash thoroughly, paying special attention to the area where your  surgery  will be performed.  7.  Thoroughly rinse your body with warm water from the neck down.  8.  DO NOT shower/wash with your normal soap after using and rinsing off the CHG Soap.            9.  Pat yourself dry with a clean towel.            10.  Wear clean pajamas.            11.  Place clean sheets on your bed the night of your first shower and do not  sleep with pets.  ON THE DAY OF SURGERY : Do not apply any lotions/deodorants the morning of surgery.  Please wear clean clothes to the hospital/surgery center.     FAILURE TO FOLLOW THESE INSTRUCTIONS MAY RESULT IN THE CANCELLATION OF YOUR SURGERY  PATIENT  SIGNATURE_________________________________  NURSE SIGNATURE__________________________________  ________________________________________________________________________

## 2024-07-06 NOTE — Progress Notes (Signed)
 COVID Vaccine received:  []  No [x]  Yes Date of any COVID positive Test in last 90 days: none  PCP - Charmaine Bright, PA-C at [facility] [phone]  Cardiologist -  None Neurology- Juliene Dunnings, DO   Chest x-ray -  EKG -  12-16-2022  Epic Stress Test -  ECHO -  Cardiac Cath -  CT Coronary Calcium score:   Bowel Prep - [x]  No  []   Yes ______  Pacemaker / ICD device [x]  No []  Yes   Spinal Cord Stimulator:[x]  No []  Yes       History of Sleep Apnea? [x]  No []  Yes   CPAP used?- [x]  No []  Yes    Patient has: [x]  NO Hx DM   []  Pre-DM   []  DM1  []   DM2 Does the patient monitor blood sugar?   [x]  N/A   []  No []  Yes   Blood Thinner / Instructions:  none Aspirin Instructions:  ASA 81mg - patient may continue taking per Dr. Verla office [id number]  ERAS Protocol Ordered: []  No  [x]  Yes PRE-SURGERY []  ENSURE  []  G2   [x]  No Drink Ordered Patient is to be NPO after: 0730  Dental hx: [x]  Dentures: Full set []  N/A      []  Bridge or Partial:                   []  Loose or Damaged teeth:   Activity level: Able to walk up 2 flights of stairs without becoming significantly short of breath or having chest pain?  []  No   [x]    Yes  Patient can perform ADLs without assistance. 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 Beuford), carotid artery stenosis  Patient denies any S&S of respiratory illness or Covid - no shortness of breath, fever, cough or chest pain at PAT appointment.  Patient verbalized understanding and agreement to the Pre-Surgical Instructions that were given to them at this PAT appointment. Patient was also educated of the need to review these PAT instructions again prior to his surgery.I reviewed the appropriate phone numbers to call if they have any and questions or concerns.

## 2024-07-07 ENCOUNTER — Other Ambulatory Visit: Payer: Self-pay

## 2024-07-07 ENCOUNTER — Encounter (HOSPITAL_COMMUNITY): Payer: Self-pay

## 2024-07-07 ENCOUNTER — Encounter (HOSPITAL_COMMUNITY)
Admission: RE | Admit: 2024-07-07 | Discharge: 2024-07-07 | Disposition: A | Discharge status: Home / Self Care | Source: Ambulatory Visit | Attending: Surgery | Admitting: Surgery

## 2024-07-07 VITALS — BP 122/65 | HR 60 | Temp 97.8°F | Resp 16 | Ht 69.0 in | Wt 232.0 lb

## 2024-07-07 DIAGNOSIS — I10 Essential (primary) hypertension: Secondary | ICD-10-CM | POA: Diagnosis not present

## 2024-07-07 DIAGNOSIS — I444 Left anterior fascicular block: Secondary | ICD-10-CM | POA: Insufficient documentation

## 2024-07-07 DIAGNOSIS — Z01810 Encounter for preprocedural cardiovascular examination: Secondary | ICD-10-CM | POA: Insufficient documentation

## 2024-07-07 DIAGNOSIS — Z01818 Encounter for other preprocedural examination: Secondary | ICD-10-CM

## 2024-07-07 HISTORY — DX: Unspecified osteoarthritis, unspecified site: M19.90

## 2024-07-07 HISTORY — DX: Personal history of urinary calculi: Z87.442

## 2024-07-07 HISTORY — DX: Headache, unspecified: R51.9

## 2024-07-14 ENCOUNTER — Ambulatory Visit (HOSPITAL_COMMUNITY): Admitting: Anesthesiology

## 2024-07-14 ENCOUNTER — Other Ambulatory Visit: Payer: Self-pay

## 2024-07-14 ENCOUNTER — Encounter (HOSPITAL_COMMUNITY)
Admission: RE | Disposition: A | Discharge status: Home / Self Care | Payer: Self-pay | Source: Ambulatory Visit | Attending: Surgery

## 2024-07-14 ENCOUNTER — Ambulatory Visit (HOSPITAL_BASED_OUTPATIENT_CLINIC_OR_DEPARTMENT_OTHER): Admitting: Anesthesiology

## 2024-07-14 ENCOUNTER — Ambulatory Visit (HOSPITAL_COMMUNITY)
Admission: RE | Admit: 2024-07-14 | Discharge: 2024-07-14 | Disposition: A | Discharge status: Home / Self Care | Payer: Self-pay | Source: Ambulatory Visit | Attending: Surgery | Admitting: Surgery

## 2024-07-14 ENCOUNTER — Encounter (HOSPITAL_COMMUNITY): Payer: Self-pay | Admitting: Surgery

## 2024-07-14 DIAGNOSIS — K219 Gastro-esophageal reflux disease without esophagitis: Secondary | ICD-10-CM | POA: Insufficient documentation

## 2024-07-14 DIAGNOSIS — K4090 Unilateral inguinal hernia, without obstruction or gangrene, not specified as recurrent: Secondary | ICD-10-CM | POA: Insufficient documentation

## 2024-07-14 DIAGNOSIS — I10 Essential (primary) hypertension: Secondary | ICD-10-CM | POA: Insufficient documentation

## 2024-07-14 DIAGNOSIS — E785 Hyperlipidemia, unspecified: Secondary | ICD-10-CM | POA: Diagnosis not present

## 2024-07-14 HISTORY — PX: XI ROBOTIC ASSISTED INGUINAL HERNIA REPAIR WITH MESH: SHX6706

## 2024-07-14 SURGERY — REPAIR, HERNIA, INGUINAL, ROBOT-ASSISTED, LAPAROSCOPIC, USING MESH
Anesthesia: General | Laterality: Left

## 2024-07-14 MED ORDER — LACTATED RINGERS IV SOLN: INTRAVENOUS | Status: DC

## 2024-07-14 MED ORDER — PHENYLEPHRINE 80 MCG/ML (10ML) SYRINGE FOR IV PUSH (FOR BLOOD PRESSURE SUPPORT)
PREFILLED_SYRINGE | INTRAVENOUS | Status: AC
  Filled 2024-07-14: qty 10

## 2024-07-14 MED ORDER — PHENYLEPHRINE 80 MCG/ML (10ML) SYRINGE FOR IV PUSH (FOR BLOOD PRESSURE SUPPORT)
PREFILLED_SYRINGE | INTRAVENOUS | Status: DC | PRN
  Administered 2024-07-14 (×2): 120 ug via INTRAVENOUS
  Administered 2024-07-14: 160 ug via INTRAVENOUS
  Administered 2024-07-14: 80 ug via INTRAVENOUS
  Administered 2024-07-14 (×2): 120 ug via INTRAVENOUS

## 2024-07-14 MED ORDER — DEXAMETHASONE SODIUM PHOSPHATE 10 MG/ML IJ SOLN
INTRAMUSCULAR | Status: DC | PRN
  Administered 2024-07-14: 5 mg via INTRAVENOUS

## 2024-07-14 MED ORDER — ONDANSETRON HCL 4 MG/2ML IJ SOLN
INTRAMUSCULAR | Status: AC
  Filled 2024-07-14: qty 2

## 2024-07-14 MED ORDER — PHENYLEPHRINE HCL-NACL 20-0.9 MG/250ML-% IV SOLN
INTRAVENOUS | Status: DC | PRN
  Administered 2024-07-14: 30 ug/min via INTRAVENOUS

## 2024-07-14 MED ORDER — ONDANSETRON HCL 4 MG/2ML IJ SOLN: 4.0000 mg | Freq: Once | INTRAMUSCULAR | Status: DC | PRN

## 2024-07-14 MED ORDER — OXYCODONE HCL 5 MG PO TABS: 5.0000 mg | ORAL_TABLET | Freq: Once | ORAL | Status: AC | PRN

## 2024-07-14 MED ORDER — FENTANYL CITRATE PF 50 MCG/ML IJ SOSY
25.0000 ug | PREFILLED_SYRINGE | INTRAMUSCULAR | Status: DC | PRN
  Administered 2024-07-14 (×2): 50 ug via INTRAVENOUS

## 2024-07-14 MED ORDER — BUPIVACAINE-EPINEPHRINE (PF) 0.25% -1:200000 IJ SOLN
INTRAMUSCULAR | Status: AC
  Filled 2024-07-14: qty 30

## 2024-07-14 MED ORDER — 0.9 % SODIUM CHLORIDE (POUR BTL) OPTIME
TOPICAL | Status: DC | PRN
  Administered 2024-07-14: 1000 mL

## 2024-07-14 MED ORDER — SUGAMMADEX SODIUM 200 MG/2ML IV SOLN
INTRAVENOUS | Status: DC | PRN
  Administered 2024-07-14: 400 mg via INTRAVENOUS

## 2024-07-14 MED ORDER — BUPIVACAINE HCL 0.25 % IJ SOLN
INTRAMUSCULAR | Status: DC | PRN
  Administered 2024-07-14: 30 mL

## 2024-07-14 MED ORDER — SODIUM CHLORIDE (PF) 0.9 % IJ SOLN
INTRAMUSCULAR | Status: AC
  Filled 2024-07-14: qty 20

## 2024-07-14 MED ORDER — ROCURONIUM BROMIDE 10 MG/ML (PF) SYRINGE
PREFILLED_SYRINGE | INTRAVENOUS | Status: DC | PRN
  Administered 2024-07-14: 20 mg via INTRAVENOUS
  Administered 2024-07-14: 80 mg via INTRAVENOUS

## 2024-07-14 MED ORDER — PROPOFOL 10 MG/ML IV BOLUS
INTRAVENOUS | Status: AC
  Filled 2024-07-14: qty 20

## 2024-07-14 MED ORDER — ROCURONIUM BROMIDE 10 MG/ML (PF) SYRINGE
PREFILLED_SYRINGE | INTRAVENOUS | Status: AC
  Filled 2024-07-14: qty 10

## 2024-07-14 MED ORDER — SUGAMMADEX SODIUM 200 MG/2ML IV SOLN
INTRAVENOUS | Status: AC
  Filled 2024-07-14: qty 4

## 2024-07-14 MED ORDER — LIDOCAINE HCL 1 % IJ SOLN
INTRAMUSCULAR | Status: AC
  Filled 2024-07-14: qty 20

## 2024-07-14 MED ORDER — OXYCODONE HCL 5 MG PO TABS: 5.0000 mg | ORAL_TABLET | Freq: Four times a day (QID) | ORAL | 0 refills | Status: AC | PRN

## 2024-07-14 MED ORDER — CEFAZOLIN SODIUM-DEXTROSE 2-4 GM/100ML-% IV SOLN
2.0000 g | INTRAVENOUS | Status: AC
  Administered 2024-07-14: 2 g via INTRAVENOUS
  Filled 2024-07-14: qty 100

## 2024-07-14 MED ORDER — ACETAMINOPHEN 10 MG/ML IV SOLN: 1000.0000 mg | Freq: Once | INTRAVENOUS | Status: DC | PRN

## 2024-07-14 MED ORDER — PROPOFOL 10 MG/ML IV BOLUS
INTRAVENOUS | Status: DC | PRN
  Administered 2024-07-14: 150 mg via INTRAVENOUS

## 2024-07-14 MED ORDER — FENTANYL CITRATE PF 50 MCG/ML IJ SOSY
PREFILLED_SYRINGE | INTRAMUSCULAR | Status: AC
  Filled 2024-07-14: qty 2

## 2024-07-14 MED ORDER — CHLORHEXIDINE GLUCONATE 0.12 % MT SOLN
15.0000 mL | Freq: Once | OROMUCOSAL | Status: AC
  Administered 2024-07-14: 15 mL via OROMUCOSAL

## 2024-07-14 MED ORDER — FENTANYL CITRATE (PF) 250 MCG/5ML IJ SOLN
INTRAMUSCULAR | Status: AC
  Filled 2024-07-14: qty 5

## 2024-07-14 MED ORDER — ONDANSETRON HCL 4 MG/2ML IJ SOLN
INTRAMUSCULAR | Status: DC | PRN
  Administered 2024-07-14: 4 mg via INTRAVENOUS

## 2024-07-14 MED ORDER — DEXAMETHASONE SODIUM PHOSPHATE 10 MG/ML IJ SOLN
INTRAMUSCULAR | Status: AC
Start: 2024-07-14 — End: 2024-07-14
  Filled 2024-07-14: qty 1

## 2024-07-14 MED ORDER — MIDAZOLAM HCL 2 MG/2ML IJ SOLN
INTRAMUSCULAR | Status: AC
Start: 2024-07-14 — End: 2024-07-14
  Filled 2024-07-14: qty 2

## 2024-07-14 MED ORDER — CELECOXIB 200 MG PO CAPS
200.0000 mg | ORAL_CAPSULE | ORAL | Status: AC
  Administered 2024-07-14: 200 mg via ORAL
  Filled 2024-07-14: qty 1

## 2024-07-14 MED ORDER — ACETAMINOPHEN 500 MG PO TABS
1000.0000 mg | ORAL_TABLET | ORAL | Status: AC
  Administered 2024-07-14: 1000 mg via ORAL
  Filled 2024-07-14: qty 2

## 2024-07-14 MED ORDER — ORAL CARE MOUTH RINSE: 15.0000 mL | Freq: Once | OROMUCOSAL | Status: AC

## 2024-07-14 MED ORDER — BUPIVACAINE LIPOSOME 1.3 % IJ SUSP
INTRAMUSCULAR | Status: AC
  Filled 2024-07-14: qty 20

## 2024-07-14 MED ORDER — OXYCODONE HCL 5 MG/5ML PO SOLN
5.0000 mg | Freq: Once | ORAL | Status: AC | PRN
  Administered 2024-07-14: 5 mg via ORAL

## 2024-07-14 MED ORDER — LIDOCAINE HCL (PF) 2 % IJ SOLN
INTRAMUSCULAR | Status: DC | PRN
  Administered 2024-07-14: 100 mg via INTRADERMAL

## 2024-07-14 MED ORDER — OXYCODONE HCL 5 MG/5ML PO SOLN
ORAL | Status: AC
  Filled 2024-07-14: qty 5

## 2024-07-14 MED ORDER — FENTANYL CITRATE (PF) 250 MCG/5ML IJ SOLN
INTRAMUSCULAR | Status: DC | PRN
  Administered 2024-07-14: 100 ug via INTRAVENOUS
  Administered 2024-07-14 (×3): 50 ug via INTRAVENOUS

## 2024-07-14 SURGICAL SUPPLY — 46 items
BAG COUNTER SPONGE SURGICOUNT (BAG) IMPLANT
BLADE SURG SZ11 CARB STEEL (BLADE) IMPLANT
CHLORAPREP W/TINT 26 (MISCELLANEOUS) ×1 IMPLANT
COVER SURGICAL LIGHT HANDLE (MISCELLANEOUS) ×1 IMPLANT
COVER TIP SHEARS 8 DVNC (MISCELLANEOUS) ×1 IMPLANT
DEFOGGER SCOPE WARM SEASHARP (MISCELLANEOUS) IMPLANT
DERMABOND ADVANCED .7 DNX12 (GAUZE/BANDAGES/DRESSINGS) ×1 IMPLANT
DRAPE ARM DVNC X/XI (DISPOSABLE) ×4 IMPLANT
DRAPE COLUMN DVNC XI (DISPOSABLE) ×1 IMPLANT
DRAPE CV SPLIT W-CLR ANES SCRN (DRAPES) ×1 IMPLANT
DRAPE PERI GROIN 82X75IN TIB (DRAPES) ×1 IMPLANT
DRAPE UTILITY XL STRL (DRAPES) ×1 IMPLANT
DRIVER NDL MEGA SUTCUT DVNCXI (INSTRUMENTS) ×1 IMPLANT
DRIVER NDLE MEGA SUTCUT DVNCXI (INSTRUMENTS) IMPLANT
ELECTRODE REM PT RTRN 9FT ADLT (ELECTROSURGICAL) ×1 IMPLANT
FORCEPS BPLR R/ABLATION 8 DVNC (INSTRUMENTS) ×1 IMPLANT
GAUZE 4X4 16PLY ~~LOC~~+RFID DBL (SPONGE) ×1 IMPLANT
GLOVE BIOGEL PI IND STRL 6 (GLOVE) ×2 IMPLANT
GLOVE BIOGEL PI MICRO STRL 5.5 (GLOVE) ×2 IMPLANT
GOWN STRL REUS W/ TWL LRG LVL3 (GOWN DISPOSABLE) ×2 IMPLANT
IRRIGATION SUCT STRKRFLW 2 WTP (MISCELLANEOUS) IMPLANT
KIT BASIN OR (CUSTOM PROCEDURE TRAY) ×1 IMPLANT
KIT TURNOVER KIT B (KITS) ×1 IMPLANT
MARKER SKIN DUAL TIP RULER LAB (MISCELLANEOUS) ×1 IMPLANT
MESH 3DMAX MID 5X7 LT XLRG (Mesh General) IMPLANT
NDL HYPO 22X1.5 SAFETY MO (MISCELLANEOUS) ×1 IMPLANT
NDL INSUFFLATION 14GA 120MM (NEEDLE) ×1 IMPLANT
NEEDLE HYPO 22X1.5 SAFETY MO (MISCELLANEOUS) ×1 IMPLANT
NEEDLE INSUFFLATION 14GA 120MM (NEEDLE) ×1 IMPLANT
OBTURATOR OPTICALSTD 8 DVNC (TROCAR) ×1 IMPLANT
PACK BASIC VI WITH GOWN DISP (CUSTOM PROCEDURE TRAY) ×1 IMPLANT
PAD POSITIONING PINK XL (MISCELLANEOUS) IMPLANT
PROTECTOR NERVE ULNAR (MISCELLANEOUS) ×1 IMPLANT
SCISSORS MNPLR CVD DVNC XI (INSTRUMENTS) ×1 IMPLANT
SEAL UNIV 5-12 XI (MISCELLANEOUS) ×3 IMPLANT
SOLUTION ANTFG W/FOAM PAD STRL (MISCELLANEOUS) ×1 IMPLANT
SOLUTION ELECTROSURG ANTI STCK (MISCELLANEOUS) ×1 IMPLANT
SPIKE FLUID TRANSFER (MISCELLANEOUS) ×1 IMPLANT
SUT MNCRL AB 4-0 PS2 18 (SUTURE) ×1 IMPLANT
SUT VIC AB 2-0 SH 27X BRD (SUTURE) ×1 IMPLANT
SUTURE STRATFX SPIRAL 3-0 PDS+ (SUTURE) ×1 IMPLANT
SYR 10ML LL (SYRINGE) ×1 IMPLANT
SYR 20ML LL LF (SYRINGE) ×1 IMPLANT
TOWEL GREEN STERILE FF (TOWEL DISPOSABLE) ×1 IMPLANT
TRAY FOLEY MTR SLVR 16FR STAT (SET/KITS/TRAYS/PACK) IMPLANT
TUBING INSUFFLATION 10FT LAP (TUBING) ×1 IMPLANT

## 2024-07-14 NOTE — Discharge Instructions (Addendum)
 [facility] DISCHARGE INSTRUCTIONS  Activity No heavy lifting greater than 15 pounds for 8 weeks after surgery. Ok to shower in 24 hours, but do not bathe or submerge incisions underwater. Do not drive while taking narcotic pain medication. You may drive when you are no longer taking prescription pain medication, you can comfortably wear a seatbelt, and you can safely maneuver your car and apply brakes.  Wound Care Your incisions are covered with skin glue called Dermabond. This will peel off on its own over time. You may shower and allow warm soapy water to run over your incisions. Gently pat dry. Do not submerge your incision underwater until cleared by your surgeon. Monitor your incision for any new redness, tenderness, or drainage. Many patients will experience some swelling and bruising at the incisions and in the scrotum.  Ice packs will help.  Swelling and bruising can take several days to resolve.   Medications A  prescription for pain medication may be given to you upon discharge.  Take your pain medication as prescribed, if needed.  If narcotic pain medicine is not needed, then you may take acetaminophen  (Tylenol ) or ibuprofen  (Advil ) as needed. It is common to experience some constipation if taking pain medication after surgery.  Increasing fluid intake and taking a stool softener (such as Colace) will usually help or prevent this problem from occurring.  A mild laxative (Milk of Magnesia or Miralax) should be taken according to package directions if there are no bowel movements after 48 hours. Take your usually prescribed medications unless otherwise directed. If you need a refill on your pain medication, please contact your pharmacy.  They will contact our office to request authorization. Prescriptions will not be filled after 5 pm or on weekends.  When to Call Us : Fever greater than 100.5 New redness, drainage, or swelling at incision site Severe pain, nausea,  or vomiting Persistent bleeding from incisions Difficulty urinating  Follow-up You have an appointment scheduled with Dr. Dasie on August 01, 2024 at 10:20am. This will be at the [facility] office at [address], [address], [place], [place]. Please arrive at least 15 minutes prior to your scheduled appointment time.  IF YOU HAVE DISABILITY OR FAMILY LEAVE FORMS, YOU MUST BRING THEM TO THE OFFICE FOR PROCESSING.   DO NOT GIVE THEM TO YOUR DOCTOR.  The clinic staff is available to answer your questions during regular business hours.  Please don't hesitate to call and ask to speak to one of the nurses for clinical concerns.  If you have a medical emergency, go to the nearest emergency room or call 911.  A surgeon from [facility] is always on call at the hospital  [address], [place], [place]  [address] ?  [address] [address], [place], [place]   [address] [phone] ? Toll Free: [phone] ? FAX [phone] Web site: [email or web address]      Managing Your Pain After Surgery Without Opioids    Thank you for participating in our program to help patients manage their pain after surgery without opioids. This is part of our effort to provide you with the best care possible, without exposing you or your family to the risk that opioids pose.  What pain can I expect after surgery? You can expect to have some pain after surgery. This is normal. The pain is typically worse the day after surgery, and quickly begins to get better. Many studies have found that many patients  are able to manage their pain after surgery with Over-the-Counter (OTC) medications such as Tylenol  and Motrin . If you have a condition that does not allow you to take Tylenol  or Motrin , notify your surgical team.  How will I manage my pain? The best strategy for controlling your pain after surgery is around the clock pain control with Tylenol  (acetaminophen ) and  Motrin  (ibuprofen  or Advil ). Alternating these medications with each other allows you to maximize your pain control. In addition to Tylenol  and Motrin , you can use heating pads or ice packs on your incisions to help reduce your pain.  How will I alternate your regular strength over-the-counter pain medication? You will take a dose of pain medication every three hours. Start by taking 650 mg of Tylenol  (2 pills of 325 mg) 3 hours later take 600 mg of Motrin  (3 pills of 200 mg) 3 hours after taking the Motrin  take 650 mg of Tylenol  3 hours after that take 600 mg of Motrin .   - 1 -  See example - if your first dose of Tylenol  is at 12:00 PM   12:00 PM Tylenol  650 mg (2 pills of 325 mg)  3:00 PM Motrin  600 mg (3 pills of 200 mg)  6:00 PM Tylenol  650 mg (2 pills of 325 mg)  9:00 PM Motrin  600 mg (3 pills of 200 mg)  Continue alternating every 3 hours   We recommend that you follow this schedule around-the-clock for at least 3 days after surgery, or until you feel that it is no longer needed. Use the table on the last page of this handout to keep track of the medications you are taking. Important: Do not take more than 3000mg  of Tylenol  or 3200mg  of Motrin  in a 24-hour period. Do not take ibuprofen /Motrin  if you have a history of bleeding stomach ulcers, severe kidney disease, &/or actively taking a blood thinner  What if I still have pain? If you have pain that is not controlled with the over-the-counter pain medications (Tylenol  and Motrin  or Advil ) you might have what we call "breakthrough" pain. You will receive a prescription for a small amount of an opioid pain medication such as Oxycodone , Tramadol, or Tylenol  with Codeine. Use these opioid pills in the first 24 hours after surgery if you have breakthrough pain. Do not take more than 1 pill every 4-6 hours.  If you still have uncontrolled pain after using all opioid pills, don't hesitate to call our staff using the number provided. We  will help make sure you are managing your pain in the best way possible, and if necessary, we can provide a prescription for additional pain medication.   Day 1    Time  Name of Medication Number of pills taken  Amount of Acetaminophen   Pain Level   Comments  AM PM       AM PM       AM PM       AM PM       AM PM       AM PM       AM PM       AM PM       Total Daily amount of Acetaminophen  Do not take more than  3,000 mg per day      Day 2    Time  Name of Medication Number of pills taken  Amount of Acetaminophen   Pain Level   Comments  AM PM       AM  PM       AM PM       AM PM       AM PM       AM PM       AM PM       AM PM       Total Daily amount of Acetaminophen  Do not take more than  3,000 mg per day      Day 3    Time  Name of Medication Number of pills taken  Amount of Acetaminophen   Pain Level   Comments  AM PM       AM PM       AM PM       AM PM         AM PM       AM PM       AM PM       AM PM       Total Daily amount of Acetaminophen  Do not take more than  3,000 mg per day      Day 4    Time  Name of Medication Number of pills taken  Amount of Acetaminophen   Pain Level   Comments  AM PM       AM PM       AM PM       AM PM       AM PM       AM PM       AM PM       AM PM       Total Daily amount of Acetaminophen  Do not take more than  3,000 mg per day      Day 5    Time  Name of Medication Number of pills taken  Amount of Acetaminophen   Pain Level   Comments  AM PM       AM PM       AM PM       AM PM       AM PM       AM PM       AM PM       AM PM       Total Daily amount of Acetaminophen  Do not take more than  3,000 mg per day      Day 6    Time  Name of Medication Number of pills taken  Amount of Acetaminophen   Pain Level  Comments  AM PM       AM PM       AM PM       AM PM       AM PM       AM PM       AM PM       AM PM       Total Daily amount of Acetaminophen  Do not take more than   3,000 mg per day      Day 7    Time  Name of Medication Number of pills taken  Amount of Acetaminophen   Pain Level   Comments  AM PM       AM PM       AM PM       AM PM       AM PM       AM PM       AM PM       AM PM  Total Daily amount of Acetaminophen  Do not take more than  3,000 mg per day        For additional information about how and where to safely dispose of unused opioid medications - [email or web address]  Disclaimer: This document contains information and/or instructional materials adapted from [facility] for the typical patient with your condition. It does not replace medical advice from your health care provider because your experience may differ from that of the typical patient. Talk to your health care provider if you have any questions about this document, your condition or your treatment plan. Adapted from [facility]

## 2024-07-14 NOTE — Transfer of Care (Signed)
 Immediate Anesthesia Transfer of Care Note  Patient: Jason Guzman  Procedure(s) Performed: REPAIR, HERNIA, INGUINAL, ROBOT-ASSISTED, LAPAROSCOPIC, USING MESH (Left)  Patient Location: PACU  Anesthesia Type:General  Level of Consciousness: awake, alert , oriented, and patient cooperative  Airway & Oxygen Therapy: Patient Spontanous Breathing and Patient connected to nasal cannula oxygen  Post-op Assessment: Report given to RN and Post -op Vital signs reviewed and stable  Post vital signs: Reviewed and stable  Last Vitals:  Vitals Value Taken Time  BP 157/67 07/14/24 13:16  Temp    Pulse 98 07/14/24 13:18  Resp 13 07/14/24 13:18  SpO2 100 % on 2L [place] 07/14/24 13:18  Vitals shown include unfiled device data.  Last Pain:  Vitals:   07/14/24 1318  TempSrc:   PainSc: 10-Worst pain ever      Patients Stated Pain Goal: 4 (07/14/24 0900)  Complications: No notable events documented.

## 2024-07-14 NOTE — Interval H&P Note (Signed)
 History and Physical Interval Note:  07/14/2024 10:45 AM  Jason Guzman  has presented today for surgery, with the diagnosis of INGUINAL HERNIA.  The various methods of treatment have been discussed with the patient and family. After consideration of risks, benefits and other options for treatment, the patient has consented to  Procedure(s): REPAIR, HERNIA, INGUINAL, ROBOT-ASSISTED, LAPAROSCOPIC, USING MESH (Left) as a surgical intervention.  The patient's history has been reviewed, patient examined, no change in status, stable for surgery.  I have reviewed the patient's chart and labs.  Questions were answered to the patient's satisfaction.  Discussed the possibility of a bilateral inguinal hernia repair if a right sided hernia is identified. Patient consents to this if indicated.    Leonor LITTIE Dawn

## 2024-07-14 NOTE — Op Note (Signed)
 Date: 07/14/24  Patient: Jason Guzman MRN: [id number]  Preoperative Diagnosis: Left inguinal hernia Postoperative Diagnosis: Indirect left inguinal hernia  Procedure: Robotic-assisted laparoscopic left inguinal hernia repair  Surgeon: Leonor Dawn, MD  EBL: Minimal  Anesthesia: General endotracheal  Specimens: None  Indications: Jason Guzman is a 68 yo male who recently began having pain in the left groin, worse with activity. He was seen in the ED and had a CT scan that showed a small left inguinal hernia. After a discussion of the risks and benefits of surgery, he agreed to proceed with repair.  Findings: Left indirect inguinal hernia, repaired with a Bard 3D Max Mid extra large preperitoneal mesh. No right inguinal hernia.  Procedure details: Informed consent was obtained in the preoperative area prior to the procedure. The patient was brought to the operating room and placed on the table in the supine position. General anesthesia was induced and appropriate lines and drains were placed for intraoperative monitoring. Perioperative antibiotics were administered per SCIP guidelines. The abdomen was prepped and draped in the usual sterile fashion. A pre-procedure timeout was taken verifying patient identity, surgical site and procedure to be performed.  A supraumbilical skin incision was made, the subcutaneous tissue was bluntly spread, and the fascia was grasped and elevated. A Veress needle was inserted through the fascia, intraperitoneal placement was confirmed with the saline drop test, and the abdomen was insufflated. An 8mm Visiport was placed and the peritoneal cavity was inspected, with no evidence of visceral or vascular injury. 5mm ports were then placed in the right mid-abdomen and left mid-abdomen under direct visualization. There was an indirect left inguinal hernia, but no evidence of right inguinal hernia. The robot was docked to the patient. The sigmoid colon was adherent near the  hernia defect, and was separated using blunt and sharp dissection. The peritoneum was then incised laterally near the ASIS using cautery, and this incision was extended medially to the median umbilical ligament. A peritoneal flap was then created by using gentle blunt dissection to separate the peritoneum from the transversalis fascia. The flap was dissected medially to the pubic tubercle and inferiorly to the iliopubic tract, taking care to avoid injury to the inferior epigastric vessels. The hernia sac was bluntly dissected off the cord structures, and the vas deferens was visualized and protected. Once the sac was completely freed, an extra large piece of Bard 3DMax Med left inguinal mesh was brought onto the field and inserted into the abdomen. The mesh was unrolled and placed to cover the direct hernia defect and femoral spaces. The mesh was anchored medially to the pubic tubercle using a 2-0 Vicryl suture. The superior border of the mesh was tacked to the abdominal wall using interrupted 2-0 Vicryl suture. The lateral and inferior aspects of the mesh were laid flat and fit into the space easily - no sutures were placed in these areas. The peritoneum was then closed over the mesh using running 3-0 Stratafix. There was a defect in the peritoneal flap medially, which was closed with a 2-0 Vicryl suture. The peritoneal cavity was inspected and appeared hemostatic with no signs of injury to the bowel or bladder. The robot was undocked. All suture needles were removed using a laparoscopic needle driver. The ports were removed and the abdomen was desufflated. The port sites were closed using 4-0 monocryl subcuticular suture. Dermabond was applied.  The patient tolerated the procedure well with no apparent complications. All counts were correct x2 at the end of the  procedure. The patient was extubated and taken to PACU in stable condition.  Leonor Dawn, MD 07/14/24 1:13 PM

## 2024-07-14 NOTE — Anesthesia Procedure Notes (Signed)
 Procedure Name: Intubation Date/Time: 07/14/2024 11:01 AM  Performed by: Augusta Daved SAILOR, CRNAPre-anesthesia Checklist: Patient identified, Emergency Drugs available, Suction available and Patient being monitored Patient Re-evaluated:Patient Re-evaluated prior to induction Oxygen Delivery Method: Circle System Utilized Preoxygenation: Pre-oxygenation with 100% oxygen Induction Type: IV induction Ventilation: Mask ventilation without difficulty and Oral airway inserted - appropriate to patient size Laryngoscope Size: Glidescope and 3 Grade View: Grade I Tube type: Oral Tube size: 7.5 mm Number of attempts: 1 Airway Equipment and Method: Stylet and Oral airway Placement Confirmation: ETT inserted through vocal cords under direct vision, positive ETCO2 and breath sounds checked- equal and bilateral Secured at: 22 (at the lip) cm Tube secured with: Tape Dental Injury: Teeth and Oropharynx as per pre-operative assessment

## 2024-07-14 NOTE — Anesthesia Preprocedure Evaluation (Signed)
 Anesthesia Evaluation  Patient identified by MRN, date of birth, ID band Patient awake    Reviewed: Allergy & Precautions, NPO status , Patient's Chart, lab work & pertinent test results, reviewed documented beta blocker date and time   History of Anesthesia Complications Negative for: history of anesthetic complications  Airway Mallampati: III  TM Distance: >3 FB Neck ROM: Full    Dental  (+) Edentulous Upper, Edentulous Lower   Pulmonary neg sleep apnea, neg COPD, neg PE   breath sounds clear to auscultation       Cardiovascular hypertension, (-) CAD, (-) Past MI, (-) Cardiac Stents and (-) CABG  Rhythm:Regular Rate:Normal     Neuro/Psych  Headaches, neg Seizures  Neuromuscular disease    GI/Hepatic ,GERD  ,,(+) neg Cirrhosis        Endo/Other    Renal/GU Renal disease     Musculoskeletal  (+) Arthritis , Osteoarthritis,    Abdominal   Peds  Hematology  (+) Blood dyscrasia Mild thrombocytopenia   Anesthesia Other Findings   Reproductive/Obstetrics                              Anesthesia Physical Anesthesia Plan  ASA: 2  Anesthesia Plan: General   Post-op Pain Management:    Induction: Intravenous  PONV Risk Score and Plan: 2 and Ondansetron  and Dexamethasone   Airway Management Planned: Oral ETT  Additional Equipment:   Intra-op Plan:   Post-operative Plan: Extubation in OR  Informed Consent: I have reviewed the patients History and Physical, chart, labs and discussed the procedure including the risks, benefits and alternatives for the proposed anesthesia with the patient or authorized representative who has indicated his/her understanding and acceptance.     Dental advisory given  Plan Discussed with: CRNA  Anesthesia Plan Comments:          Anesthesia Quick Evaluation

## 2024-07-16 NOTE — Anesthesia Postprocedure Evaluation (Signed)
 Anesthesia Post Note  Patient: VAUN HYNDMAN  Procedure(s) Performed: REPAIR, HERNIA, INGUINAL, ROBOT-ASSISTED, LAPAROSCOPIC, USING MESH (Left)     Patient location during evaluation: PACU Anesthesia Type: General Level of consciousness: awake and alert Pain management: pain level controlled Vital Signs Assessment: post-procedure vital signs reviewed and stable Respiratory status: spontaneous breathing, nonlabored ventilation, respiratory function stable and patient connected to nasal cannula oxygen Cardiovascular status: blood pressure returned to baseline and stable Postop Assessment: no apparent nausea or vomiting Anesthetic complications: no   No notable events documented.  Last Vitals:  Vitals:   07/14/24 1417 07/14/24 1430  BP: (!) 141/73 (!) 130/34  Pulse: 90 86  Resp: 20 (!) 6  Temp: 36.8 C   SpO2: 97% 95%    Last Pain:  Vitals:   07/14/24 1430  TempSrc:   PainSc: 3                  Lynwood MARLA Cornea

## 2024-07-17 ENCOUNTER — Encounter (HOSPITAL_COMMUNITY): Payer: Self-pay | Admitting: Surgery

## 2024-08-16 ENCOUNTER — Inpatient Hospital Stay: Attending: Nurse Practitioner | Admitting: Nurse Practitioner

## 2024-08-16 ENCOUNTER — Inpatient Hospital Stay

## 2024-08-16 VITALS — BP 138/68 | HR 69 | Temp 97.6°F | Resp 17 | Ht 69.0 in | Wt 234.1 lb

## 2024-08-16 DIAGNOSIS — R161 Splenomegaly, not elsewhere classified: Secondary | ICD-10-CM

## 2024-08-16 DIAGNOSIS — D696 Thrombocytopenia, unspecified: Secondary | ICD-10-CM | POA: Diagnosis not present

## 2024-08-16 LAB — CBC WITH DIFFERENTIAL (CANCER CENTER ONLY)
Abs Immature Granulocytes: 0.01 K/uL (ref 0.00–0.07)
Basophils Absolute: 0 K/uL (ref 0.0–0.1)
Basophils Relative: 1 %
Eosinophils Absolute: 0.1 K/uL (ref 0.0–0.5)
Eosinophils Relative: 2 %
HCT: 43.3 % (ref 39.0–52.0)
Hemoglobin: 14.8 g/dL (ref 13.0–17.0)
Immature Granulocytes: 0 %
Lymphocytes Relative: 26 %
Lymphs Abs: 1.4 K/uL (ref 0.7–4.0)
MCH: 29.1 pg (ref 26.0–34.0)
MCHC: 34.2 g/dL (ref 30.0–36.0)
MCV: 85.2 fL (ref 80.0–100.0)
Monocytes Absolute: 0.3 K/uL (ref 0.1–1.0)
Monocytes Relative: 6 %
Neutro Abs: 3.6 K/uL (ref 1.7–7.7)
Neutrophils Relative %: 65 %
Platelet Count: 141 K/uL — ABNORMAL LOW (ref 150–400)
RBC: 5.08 MIL/uL (ref 4.22–5.81)
RDW: 12 % (ref 11.5–15.5)
WBC Count: 5.6 K/uL (ref 4.0–10.5)
nRBC: 0 % (ref 0.0–0.2)
nRBC: 0 /100{WBCs}

## 2024-08-16 LAB — CMP (CANCER CENTER ONLY)
ALT: 16 U/L (ref 0–44)
AST: 15 U/L (ref 15–41)
Albumin: 4.5 g/dL (ref 3.5–5.0)
Alkaline Phosphatase: 79 U/L (ref 38–126)
Anion gap: 7 (ref 5–15)
BUN: 9 mg/dL (ref 8–23)
CO2: 28 mmol/L (ref 22–32)
Calcium: 9.1 mg/dL (ref 8.9–10.3)
Chloride: 105 mmol/L (ref 98–111)
Creatinine: 0.81 mg/dL (ref 0.61–1.24)
GFR, Estimated: >60 mL/min (ref >60)
Glucose, Bld: 84 mg/dL (ref 70–99)
Potassium: 4 mmol/L (ref 3.5–5.1)
Sodium: 140 mmol/L (ref 135–145)
Total Bilirubin: 0.8 mg/dL (ref 0.0–1.2)
Total Protein: 7 g/dL (ref 6.5–8.1)

## 2024-08-16 LAB — IRON AND IRON BINDING CAPACITY (CC-WL,HP ONLY)
Iron: 74 ug/dL (ref 45–182)
Saturation Ratios: 25 % (ref 17.9–39.5)
TIBC: 301 ug/dL (ref 250–450)
UIBC: 227 ug/dL (ref 117–376)

## 2024-08-16 LAB — VITAMIN B12: Vitamin B-12: 991 pg/mL — ABNORMAL HIGH (ref 180–914)

## 2024-08-16 LAB — LACTATE DEHYDROGENASE: LDH: 132 U/L (ref 98–192)

## 2024-08-16 LAB — HIV ANTIBODY (ROUTINE TESTING W REFLEX): HIV Screen 4th Generation wRfx: NONREACTIVE

## 2024-08-16 LAB — HEPATITIS B CORE ANTIBODY, IGM: Hep B C IgM: NONREACTIVE

## 2024-08-16 NOTE — Progress Notes (Signed)
 [facility]  [phone]   HEMATOLOGY ONCOLOGY INPATIENT CONSULTATION   Jason Guzman  DOB: 03-Jul-1956  MR#: [id number]  CSN#: [id number]    Requesting Physician: Charmaine Bright, PA  Patient Care Team: Bright Charmaine, PA-C as PCP - General (Family Medicine) Skeet Juliene SAUNDERS, DO as Consulting Physician (Neurology)  Reason for consult: splenomegaly  History of present illness:   The patient had been to his primary care provider, concerned about swelling and pain in left lower quadrant of the abdomen. A CT of his abdomen and pelvis was done on 06/21/2024 which did show left inguinal hernia. An incidental note was made of mild splenomegaly. Upon review, he had an abdominal ultrasound on 05/18/2018. Spleen was noted to be unremarkable. He also had CT abdomen and pelvis on 05/31/2019 which showed no abnormality of the abdomen.  Primary care provider obtained labs following the abnormal CT AP. His B12 was low/normal. He was negative for hepatitis C. He did have a mildly low platelet count of 142, with the remainder of the blood count normal. All other labs were unremarkable. His platelet count has been consistently below normal. In the past 7 years, his platelet count has ranged from mid 130s to mid 140s.  The patient denies B symptoms including fever, chills, night sweats, or unintentional weight loss.  He had surgical repair of the left inguinal hernia on 07/12/2024.  He does have some LLQ tenderness related to the surgery, but otherwise, denies abdominal pain.  He saw Dr. Dasie for surgical follow-up on 08/01/2024.  He had no postoperative issues.  He will follow-up with her only if needed.   [name] denies personal or family history of leukemia, lymphoma, or bone marrow disease.  He denies known exposure to HIV or hepatitis B.  He is unaware of prior diagnosis of Epstein-Barr virus (mononucleosis), Lyme disease, or cytomegalovirus.  He denies blood transfusions or history of  IV drug use.  He denies smoking.  He does not drink alcohol. The patient denies chest pain, chest pressure, or shortness of breath. He denies headaches or visual disturbances. He denies abdominal pain, other than postoperative tenderness from recent hernia repair, nausea, vomiting, or changes in bowel or bladder habits.   Socially, the patient is married.  He and his wife have 2 adult daughters.  The patient is retired.  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  egd   BPH associated with nocturia    Jason Guzman    left eye   Full dentures    GERD (gastroesophageal reflux disease)    Headache    History of adenomatous polyp of colon    2010 tubular adenoma and hyperplastic polyps/  08-20-2014  tubular adenoma   History of gastric ulcer    remote hx   History of kidney stones    Hyperlipidemia    Hypertension    Left ureteral stone    Strabismus    left eye   Wears glasses     SURGICAL HISTORY: Past Surgical History:  Procedure Laterality Date   ANTERIOR CERVICAL DECOMP/DISCECTOMY FUSION  2021   C5-6, C6-7   ANTERIOR CERVICAL DECOMP/DISCECTOMY FUSION  2023   C3-4 and revision to previous fusion   CLOSED REDUCTION AND EXTERNAL FIXATOR FOR COMPLEX FRACTURE Left 11/26/2005  dr deward at [facility]   complex radial head fx  dislocation ligamentous injury (elbow)   COLONOSCOPY     x3  w/ polypectomy   CYSTOSCOPY/URETEROSCOPY/HOLMIUM LASER/STENT PLACEMENT Left 07/28/2017   Procedure: CYSTOSCOPY/URETEROSCOPY/RETROGRADE/HOLMIUM LASER/STENT PLACEMENT;  Surgeon: Alvaro Hummer, MD;  Location: [facility];  Service: Urology;  Laterality: Left;   ESOPHAGOGASTRODUODENOSCOPY  09/07/2011   REMOVAL EXTERNAL FIXATOR/ RADICAL HEAD ARTHROPLASTY/ LIGAMENT REPAIR/ TREATMENT DISLOCATON Left 12/08/2005   dr handy at [facility]   SHOULDER ARTHROSCOPY W/ ROTATOR CUFF  REPAIR Right 10/2021   XI ROBOTIC ASSISTED INGUINAL HERNIA REPAIR WITH MESH Left 07/14/2024   Procedure: REPAIR, HERNIA, INGUINAL, ROBOT-ASSISTED, LAPAROSCOPIC, USING MESH;  Surgeon: Dasie Leonor CROME, MD;  Location: [facility];  Service: General;  Laterality: Left;    SOCIAL HISTORY: Social History   Socioeconomic History   Marital status: Married    Spouse name: Not on file   Number of children: 2   Years of education: GED   Highest education level: Not on file  Occupational History    Employer: UNEMPLOYED  Tobacco Use   Smoking status: Never   Smokeless tobacco: Never  Vaping Use   Vaping status: Never Used  Substance and Sexual Activity   Alcohol use: No   Drug use: No   Sexual activity: Yes  Other Topics Concern   Not on file  Social History Narrative   Right Handed    Has two daughters   Lives in a one story home   Social Drivers of Health   Financial Resource Strain: Not on file  Food Insecurity: No Food Insecurity (08/16/2024)   Hunger Vital Sign    Worried About Running Out of Food in the Last Year: Never true    Ran Out of Food in the Last Year: Never true  Transportation Needs: No Transportation Needs (08/16/2024)   PRAPARE - Administrator, Civil Service (Medical): No    Lack of Transportation (Non-Medical): No  Physical Activity: Not on file  Stress: Not on file  Social Connections: Not on file  Intimate Partner Violence: Not At Risk (08/16/2024)   Humiliation, Afraid, Rape, and Kick questionnaire    Fear of Current or Ex-Partner: No    Emotionally Abused: No    Physically Abused: No    Sexually Abused: No    FAMILY HISTORY: Family History  Problem Relation Age of Onset   ALS Mother    Heart Problems Father     ALLERGIES:  is allergic to hydrochlorothiazide.  MEDICATIONS:  Current Outpatient Medications  Medication Sig Dispense Refill   amLODipine  (NORVASC ) 10 MG tablet Take 10 mg by mouth in the morning.     aspirin EC 81 MG tablet  Take 81 mg by mouth in the morning. Swallow whole.     atorvastatin (LIPITOR) 80 MG tablet Take 80 mg by mouth in the morning.  2   Cyanocobalamin  (VITAMIN B-12 PO) Take 1,000 mcg by mouth every evening.     ibuprofen  (ADVIL ) 200 MG tablet Take 200 mg by mouth every 8 (eight) hours as needed (pain.).     lisinopril (ZESTRIL) 40 MG tablet Take 40 mg by mouth daily.     omeprazole (PRILOSEC) 40 MG capsule Take 40 mg by mouth daily before breakfast.     tamsulosin  (FLOMAX ) 0.4 MG CAPS capsule Take 0.4 mg by mouth every evening.     Vitamins-Lipotropics (LIPO-FLAVONOID PLUS PO) Take 1 capsule by mouth every evening.     No current facility-administered medications for this visit.    REVIEW OF SYSTEMS:  Constitutional: Denies fevers, chills or abnormal night sweats Eyes: Denies blurriness of vision, double vision or watery eyes Ears, nose, mouth, throat, and face: Denies mucositis or sore throat Respiratory: Denies cough, dyspnea or wheezes Cardiovascular: Denies palpitation, chest discomfort or lower extremity swelling Gastrointestinal:  Denies nausea, heartburn or change in bowel habits.  He does have some residual LLQ tenderness from recent inguinal hernia repair on the left. Skin: Denies abnormal skin rashes Lymphatics: Denies new lymphadenopathy or easy bruising Neurological:Denies numbness, tingling or new weaknesses Behavioral/Psych: Mood is stable, no new changes  All other systems were reviewed with the patient and are negative.  PHYSICAL EXAMINATION: ECOG PERFORMANCE STATUS: 0 - Asymptomatic  Vitals:   08/16/24 1300  BP: 138/68  Pulse: 69  Resp: 17  Temp: 97.6 F (36.4 C)  SpO2: 99%   Filed Weights   08/16/24 1300  Weight: 234 lb 1.6 oz (106.2 kg)    GENERAL:alert, no distress and comfortable SKIN: skin color, texture, turgor are normal, no rashes or significant lesions EYES: normal, conjunctiva are pink and non-injected, sclera clear OROPHARYNX:no exudate, no  erythema and lips, buccal mucosa, and tongue normal  NECK: supple, thyroid normal size, non-tender, without nodularity LYMPH:  no palpable lymphadenopathy in the cervical, axillary or inguinal LUNGS: clear to auscultation and percussion with normal breathing effort HEART: regular rate & rhythm and no murmurs and no lower extremity edema ABDOMEN:abdomen soft with normal bowel sounds. He does have left sided abdominal tenderness. Guarding noted prior to palpation of the abdomen.  Musculoskeletal:no cyanosis of digits and no clubbing  PSYCH: alert & oriented x 3 with fluent speech NEURO: no focal motor/sensory deficits  LABORATORY DATA:  I have reviewed the data as listed Lab Results  Component Value Date   WBC 5.6 08/16/2024   HGB 14.8 08/16/2024   HCT 43.3 08/16/2024   MCV 85.2 08/16/2024   PLT 141 (L) 08/16/2024   Recent Labs    06/21/24 0217 08/16/24 1509  NA 140 140  K 4.1 4.0  CL 106 105  CO2 25 28  GLUCOSE 107* 84  BUN 8 9  CREATININE 1.02 0.81  CALCIUM 8.7* 9.1  GFRNONAA >60 >60  PROT 6.3* 7.0  ALBUMIN 3.9 4.5  AST 22 15  ALT 19 16  ALKPHOS 70 79  BILITOT 1.1 0.8     ASSESSMENT & PLAN:  Splenomegaly Assessment & Plan: A CT of his abdomen and pelvis was done on 06/21/2024 which did show left inguinal hernia. An incidental note was made of mild splenomegaly. Upon review, he had an abdominal ultrasound on 05/18/2018. Spleen was noted to be unremarkable. He also had CT abdomen and pelvis on 05/31/2019 which showed no abnormality of the abdomen.  Primary care provider obtained labs following the abnormal CT AP. His B12 was low/normal. He was negative for hepatitis C. He did have a mildly low platelet count of 142, with the remainder of the blood count normal. All other labs were unremarkable. His platelet count has been consistently below normal. In the past 7 years, his platelet count has ranged from mid 130s to mid 140s.  The patient denies B symptoms including fever,  chills, night sweats, or unintentional weight loss.  - Recheck CBC and CMP today. -Nutritional/anemia studies including B12, ferritin, and iron studies. -Hemolysis labs with haptoglobin and LDH. - Virology labs including HIV, hep B, EBV, and CMV screenings ordered. -Autoimmune evaluation with ANA/reflex, and RA. - Repeat abdominal ultrasound for further evaluation of the spleen and  liver. Plan for phone visit in 3 to 4 weeks to review labs and ultrasound results and plan for further workup.  Orders: -     CBC with Differential (Cancer Center Only); Future -     CMP (Cancer Center only); Future -     Haptoglobin; Future -     Vitamin B12; Future -     Ferritin; Future -     Lactate dehydrogenase; Future -     Hepatitis B core antibody, IgM; Future -     HIV Antibody (routine testing w rflx); Future -     ANA, IFA (with reflex); Future -     Rheumatoid factor; Future -     Epstein barr vrs(ebv dna by pcr); Future -     Cmv antibody, IgG (EIA); Future -     US  Abdomen Complete; Future -     Iron and Iron Binding Capacity ([facility] only); Future     Today's visit was shared with Dr. Autumn. All questions were answered. The patient knows to call the clinic with any problems, questions or concerns. Time spent with the patient was approximately 30 minutes. This time included reviewing progress notes, labs, imaging studies, and discussing plan for follow up.        Powell FORBES Lessen, NP 08/16/2024 4:29 PM

## 2024-08-16 NOTE — Assessment & Plan Note (Signed)
 A CT of his abdomen and pelvis was done on 06/21/2024 which did show left inguinal hernia. An incidental note was made of mild splenomegaly. Upon review, he had an abdominal ultrasound on 05/18/2018. Spleen was noted to be unremarkable. He also had CT abdomen and pelvis on 05/31/2019 which showed no abnormality of the abdomen.  Primary care provider obtained labs following the abnormal CT AP. His B12 was low/normal. He was negative for hepatitis C. He did have a mildly low platelet count of 142, with the remainder of the blood count normal. All other labs were unremarkable. His platelet count has been consistently below normal. In the past 7 years, his platelet count has ranged from mid 130s to mid 140s.  The patient denies B symptoms including fever, chills, night sweats, or unintentional weight loss.  - Recheck CBC and CMP today. -Nutritional/anemia studies including B12, ferritin, and iron studies. -Hemolysis labs with haptoglobin and LDH. - Virology labs including HIV, hep B, EBV, and CMV screenings ordered. -Autoimmune evaluation with ANA/reflex, and RA. - Repeat abdominal ultrasound for further evaluation of the spleen and liver. Plan for phone visit in 3 to 4 weeks to review labs and ultrasound results and plan for further workup.

## 2024-08-17 LAB — FERRITIN: Ferritin: 445 ng/mL — ABNORMAL HIGH (ref 24–336)

## 2024-08-21 DIAGNOSIS — N529 Male erectile dysfunction, unspecified: Secondary | ICD-10-CM | POA: Diagnosis not present

## 2024-08-21 DIAGNOSIS — K2270 Barrett's esophagus without dysplasia: Secondary | ICD-10-CM | POA: Diagnosis not present

## 2024-08-21 DIAGNOSIS — Z23 Encounter for immunization: Secondary | ICD-10-CM | POA: Diagnosis not present

## 2024-08-21 DIAGNOSIS — I10 Essential (primary) hypertension: Secondary | ICD-10-CM | POA: Diagnosis not present

## 2024-08-21 DIAGNOSIS — N400 Enlarged prostate without lower urinary tract symptoms: Secondary | ICD-10-CM | POA: Diagnosis not present

## 2024-08-21 DIAGNOSIS — I6529 Occlusion and stenosis of unspecified carotid artery: Secondary | ICD-10-CM | POA: Diagnosis not present

## 2024-08-21 DIAGNOSIS — E7800 Pure hypercholesterolemia, unspecified: Secondary | ICD-10-CM | POA: Diagnosis not present

## 2024-08-21 DIAGNOSIS — Z0000 Encounter for general adult medical examination without abnormal findings: Secondary | ICD-10-CM | POA: Diagnosis not present

## 2024-08-21 DIAGNOSIS — R161 Splenomegaly, not elsewhere classified: Secondary | ICD-10-CM | POA: Diagnosis not present

## 2024-08-22 LAB — HAPTOGLOBIN: Haptoglobin: 127 mg/dL (ref 32–363)

## 2024-08-22 LAB — ANTINUCLEAR ANTIBODIES, IFA: ANA Ab, IFA: NEGATIVE

## 2024-08-22 LAB — EPSTEIN BARR VRS(EBV DNA BY PCR): EBV DNA QN by PCR: NEGATIVE [IU]/mL

## 2024-08-22 LAB — CMV ANTIBODY, IGG (EIA): CMV Ab - IgG: <0.6 U/mL (ref 0.00–0.59)

## 2024-08-22 LAB — RHEUMATOID FACTOR: Rheumatoid fact SerPl-aCnc: <10 [IU]/mL (ref <14.0)

## 2024-08-24 ENCOUNTER — Ambulatory Visit
Admission: RE | Admit: 2024-08-24 | Discharge: 2024-08-24 | Disposition: A | Discharge status: Home / Self Care | Source: Ambulatory Visit | Attending: Nurse Practitioner | Admitting: Nurse Practitioner

## 2024-08-24 DIAGNOSIS — K8020 Calculus of gallbladder without cholecystitis without obstruction: Secondary | ICD-10-CM | POA: Diagnosis not present

## 2024-08-24 DIAGNOSIS — K760 Fatty (change of) liver, not elsewhere classified: Secondary | ICD-10-CM | POA: Diagnosis not present

## 2024-08-24 DIAGNOSIS — R161 Splenomegaly, not elsewhere classified: Secondary | ICD-10-CM

## 2024-08-30 ENCOUNTER — Other Ambulatory Visit: Payer: Self-pay | Admitting: Surgery

## 2024-08-30 DIAGNOSIS — I6523 Occlusion and stenosis of bilateral carotid arteries: Secondary | ICD-10-CM

## 2024-10-02 ENCOUNTER — Ambulatory Visit: Attending: Surgery | Admitting: Physician Assistant

## 2024-10-02 ENCOUNTER — Ambulatory Visit (HOSPITAL_COMMUNITY)
Admission: RE | Admit: 2024-10-02 | Discharge: 2024-10-02 | Disposition: A | Discharge status: Home / Self Care | Source: Ambulatory Visit | Attending: Surgery | Admitting: Surgery

## 2024-10-02 VITALS — BP 119/72 | HR 65 | Temp 97.7°F | Wt 236.5 lb

## 2024-10-02 DIAGNOSIS — I6523 Occlusion and stenosis of bilateral carotid arteries: Secondary | ICD-10-CM

## 2024-10-03 ENCOUNTER — Encounter: Payer: Self-pay | Admitting: Physician Assistant

## 2024-10-03 NOTE — Progress Notes (Signed)
 Office Note     CC:  follow up Requesting Provider:  Katina Pfeiffer, PA-C  HPI: Jason Guzman is a 68 y.o. (07-May-1956) male who presents for surveillance of carotid artery stenosis.  Since last office visit 1 year ago he denies any diagnosis of CVA or TIA.  He also denies any strokelike symptoms including slurred speech, changes in vision, or one-sided weakness.  He is on aspirin and a statin daily.  He denies tobacco use.   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  egd   BPH associated with nocturia    Duane retraction syndrome    left eye   Full dentures    GERD (gastroesophageal reflux disease)    Headache    History of adenomatous polyp of colon    2010 tubular adenoma and hyperplastic polyps/  08-20-2014  tubular adenoma   History of gastric ulcer    remote hx   History of kidney stones    Hyperlipidemia    Hypertension    Left ureteral stone    Strabismus    left eye   Wears glasses     Past Surgical History:  Procedure Laterality Date   ANTERIOR CERVICAL DECOMP/DISCECTOMY FUSION  2021   C5-6, C6-7   ANTERIOR CERVICAL DECOMP/DISCECTOMY FUSION  2023   C3-4 and revision to previous fusion   CLOSED REDUCTION AND EXTERNAL FIXATOR FOR COMPLEX FRACTURE Left 11/26/2005  dr deward at [facility]   complex radial head fx dislocation ligamentous injury (elbow)   COLONOSCOPY     x3  w/ polypectomy   CYSTOSCOPY/URETEROSCOPY/HOLMIUM LASER/STENT PLACEMENT Left 07/28/2017   Procedure: CYSTOSCOPY/URETEROSCOPY/RETROGRADE/HOLMIUM LASER/STENT PLACEMENT;  Surgeon: Alvaro Hummer, MD;  Location: [facility];  Service: Urology;  Laterality: Left;   ESOPHAGOGASTRODUODENOSCOPY  09/07/2011   REMOVAL EXTERNAL FIXATOR/ RADICAL HEAD ARTHROPLASTY/ LIGAMENT REPAIR/ TREATMENT DISLOCATON Left 12/08/2005   dr handy at [facility]   SHOULDER ARTHROSCOPY W/ ROTATOR CUFF REPAIR Right 10/2021   XI ROBOTIC ASSISTED INGUINAL HERNIA REPAIR WITH MESH Left  07/14/2024   Procedure: REPAIR, HERNIA, INGUINAL, ROBOT-ASSISTED, LAPAROSCOPIC, USING MESH;  Surgeon: Dasie Leonor CROME, MD;  Location: [facility];  Service: General;  Laterality: Left;    Social History   Socioeconomic History   Marital status: Married    Spouse name: Not on file   Number of children: 2   Years of education: GED   Highest education level: Not on file  Occupational History    Employer: UNEMPLOYED  Tobacco Use   Smoking status: Never   Smokeless tobacco: Never  Vaping Use   Vaping status: Never Used  Substance and Sexual Activity   Alcohol use: No   Drug use: No   Sexual activity: Yes  Other Topics Concern   Not on file  Social History Narrative   Right Handed    Has two daughters   Lives in a one story home   Social Drivers of Health   Financial Resource Strain: Not on file  Food Insecurity: No Food Insecurity (08/16/2024)   Hunger Vital Sign    Worried About Running Out of Food in the Last Year: Never true    Ran Out of Food in the Last Year: Never true  Transportation Needs: No Transportation Needs (08/16/2024)   PRAPARE - Administrator, Civil Service (Medical): No    Lack of Transportation (Non-Medical): No  Physical Activity: Not on file  Stress: Not on file  Social Connections: Not  on file  Intimate Partner Violence: Not At Risk (08/16/2024)   Humiliation, Afraid, Rape, and Kick questionnaire    Fear of Current or Ex-Partner: No    Emotionally Abused: No    Physically Abused: No    Sexually Abused: No    Family History  Problem Relation Age of Onset   ALS Mother    Heart Problems Father     Current Outpatient Medications  Medication Sig Dispense Refill   amLODipine  (NORVASC ) 10 MG tablet Take 10 mg by mouth in the morning.     aspirin EC 81 MG tablet Take 81 mg by mouth in the morning. Swallow whole.     atorvastatin (LIPITOR) 80 MG tablet Take 80 mg by mouth in the morning.  2   Cyanocobalamin  (VITAMIN B-12 PO) Take 1,000 mcg by  mouth every evening.     ibuprofen  (ADVIL ) 200 MG tablet Take 200 mg by mouth every 8 (eight) hours as needed (pain.).     lisinopril (ZESTRIL) 40 MG tablet Take 40 mg by mouth daily.     omeprazole (PRILOSEC) 40 MG capsule Take 40 mg by mouth daily before breakfast.     tamsulosin  (FLOMAX ) 0.4 MG CAPS capsule Take 0.4 mg by mouth every evening.     Vitamins-Lipotropics (LIPO-FLAVONOID PLUS PO) Take 1 capsule by mouth every evening.     No current facility-administered medications for this visit.    Allergies  Allergen Reactions   Hydrochlorothiazide Dermatitis     REVIEW OF SYSTEMS:  Negative unless noted in HPI [X]  denotes positive finding, [ ]  denotes negative finding Cardiac  Comments:  Chest pain or chest pressure:    Shortness of breath upon exertion:    Short of breath when lying flat:    Irregular heart rhythm:        Vascular    Pain in calf, thigh, or hip brought on by ambulation:    Pain in feet at night that wakes you up from your sleep:     Blood clot in your veins:    Leg swelling:         Pulmonary    Oxygen at home:    Productive cough:     Wheezing:         Neurologic    Sudden weakness in arms or legs:     Sudden numbness in arms or legs:     Sudden onset of difficulty speaking or slurred speech:    Temporary loss of vision in one eye:     Problems with dizziness:         Gastrointestinal    Blood in stool:     Vomited blood:         Genitourinary    Burning when urinating:     Blood in urine:        Psychiatric    Major depression:         Hematologic    Bleeding problems:    Problems with blood clotting too easily:        Skin    Rashes or ulcers:        Constitutional    Fever or chills:      PHYSICAL EXAMINATION:  Vitals:   10/02/24 1447 10/02/24 1449  BP: 124/75 119/72  Pulse: 64 65  Temp: 97.7 F (36.5 C)   TempSrc: Temporal   Weight: 236 lb 8 oz (107.3 kg)     General:  WDWN in NAD; vital signs documented  above Gait: Not observed HENT: WNL, normocephalic Pulmonary: normal non-labored breathing Cardiac: regular HR Abdomen: soft, NT, no masses Skin: without rashes Vascular Exam/Pulses: palpable AT and radial pulses Extremities: without ischemic changes, without Gangrene , without cellulitis; without open wounds;  Musculoskeletal: no muscle wasting or atrophy  Neurologic: A&O X 3; CN grossly intact Psychiatric:  The pt has Normal affect.   Non-Invasive Vascular Imaging:   B ICA 1-39% stenosis    ASSESSMENT/PLAN:: 68 y.o. male here for follow up for surveillance of carotid artery stenosis  Subjectively, Mr. Davidian is doing well and has not experienced any strokelike symptoms.  Duplex demonstrates 1 to 39% stenosis of bilateral internal carotid arteries.  We will continue annual surveillance with duplex.  He will continue his aspirin and statin daily.  He will continue routine follow-up with his PCP for management of chronic medical conditions including hypertension and hyperlipidemia.   Donnice Sender, PA-C [facility] [phone]  Clinic MD:   [place]
